# Patient Record
Sex: Male | Born: 1955 | Race: Black or African American | Hispanic: No | Marital: Married
Health system: Midwestern US, Community
[De-identification: ages and names within clinical notes are randomized; demographics above are authoritative.]

## PROBLEM LIST (undated history)

## (undated) DIAGNOSIS — M25562 Pain in left knee: Secondary | ICD-10-CM

## (undated) DIAGNOSIS — G8929 Other chronic pain: Secondary | ICD-10-CM

## (undated) DIAGNOSIS — M25561 Pain in right knee: Secondary | ICD-10-CM

## (undated) DIAGNOSIS — I10 Essential (primary) hypertension: Secondary | ICD-10-CM

## (undated) DIAGNOSIS — R972 Elevated prostate specific antigen [PSA]: Secondary | ICD-10-CM

## (undated) DIAGNOSIS — K852 Alcohol induced acute pancreatitis without necrosis or infection: Secondary | ICD-10-CM

## (undated) DIAGNOSIS — F17209 Nicotine dependence, unspecified, with unspecified nicotine-induced disorders: Secondary | ICD-10-CM

## (undated) DIAGNOSIS — C4491 Basal cell carcinoma of skin, unspecified: Secondary | ICD-10-CM

## (undated) DIAGNOSIS — M1990 Unspecified osteoarthritis, unspecified site: Secondary | ICD-10-CM

## (undated) DIAGNOSIS — H269 Unspecified cataract: Secondary | ICD-10-CM

## (undated) DIAGNOSIS — T7840XA Allergy, unspecified, initial encounter: Secondary | ICD-10-CM

## (undated) HISTORY — DX: Unspecified cataract: H26.9

## (undated) HISTORY — PX: KNEE ARTHROSCOPY: SUR90

## (undated) HISTORY — DX: Unspecified osteoarthritis, unspecified site: M19.90

## (undated) HISTORY — DX: Allergy, unspecified, initial encounter: T78.40XA

## (undated) HISTORY — PX: WISDOM TOOTH EXTRACTION: SHX21

---
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
## 1973-09-13 HISTORY — PX: CYSTECTOMY: SUR359

## 1997-09-13 HISTORY — PX: DENTAL SURGERY: SHX609

## 1998-10-28 DIAGNOSIS — C4491 Basal cell carcinoma of skin, unspecified: Secondary | ICD-10-CM

## 1998-10-28 HISTORY — DX: Basal cell carcinoma of skin, unspecified: C44.91

## 2008-05-28 LAB — HEPATIC FUNCTION PANEL
A-G Ratio: 0.8 — ABNORMAL LOW (ref 1.1–2.2)
ALT (SGPT): 266 U/L — ABNORMAL HIGH (ref 30–65)
AST (SGOT): 545 U/L — ABNORMAL HIGH (ref 15–37)
Albumin: 3.2 g/dL — ABNORMAL LOW (ref 3.5–5.0)
Alk. phosphatase: 138 U/L — ABNORMAL HIGH (ref 50–136)
Bilirubin, direct: 0.2 MG/DL (ref 0.0–0.3)
Bilirubin, total: 1 MG/DL — ABNORMAL HIGH (ref <1.0)
Globulin: 4 g/dL (ref 2.0–4.0)
Protein, total: 7.2 g/dL (ref 6.4–8.2)

## 2008-05-28 LAB — METABOLIC PANEL, BASIC
Anion gap: 18 mmol/L — ABNORMAL HIGH (ref 5–15)
BUN/Creatinine ratio: 10 — ABNORMAL LOW (ref 12–20)
BUN: 10 MG/DL (ref 6–20)
CO2: 17 MMOL/L — ABNORMAL LOW (ref 21–32)
Calcium: 7.7 MG/DL — ABNORMAL LOW (ref 8.5–10.1)
Chloride: 100 MMOL/L (ref 97–108)
Creatinine: 1 MG/DL (ref 0.6–1.3)
GFR est AA: >60 mL/min/{1.73_m2} (ref >60)
GFR est non-AA: >60 mL/min/{1.73_m2} (ref >60)
Glucose: 139 MG/DL — ABNORMAL HIGH (ref 75–110)
Potassium: 4.1 MMOL/L (ref 3.5–5.1)
Sodium: 135 MMOL/L — ABNORMAL LOW (ref 136–145)

## 2008-05-28 LAB — LIPASE: Lipase: 660 U/L — ABNORMAL HIGH (ref 114–286)

## 2008-05-28 LAB — ETHYL ALCOHOL: ALCOHOL(ETHYL),SERUM: 72 MG/DL — AB

## 2008-05-28 LAB — AMYLASE: Amylase: 85 U/L (ref 25–115)

## 2008-08-02 LAB — CBC WITH AUTOMATED DIFF
ABS. BASOPHILS: 0 10*3/uL (ref 0.0–0.1)
ABS. EOSINOPHILS: 0.1 10*3/uL (ref 0.0–0.4)
ABS. LYMPHOCYTES: 3.1 10*3/uL (ref 0.8–3.5)
ABS. MONOCYTES: 0.5 10*3/uL (ref 0–1.0)
ABS. NEUTROPHILS: 4 10*3/uL (ref 1.8–8.0)
BASOPHILS: 1 % (ref 0–1)
EOSINOPHILS: 1 % (ref 0–7)
HCT: 38.5 % (ref 36.6–50.3)
HGB: 13.8 g/dL (ref 12.1–17.0)
LYMPHOCYTES: 40 % (ref 12–49)
MCH: 30.5 PG (ref 26.0–34.0)
MCHC: 35.8 g/dL — ABNORMAL HIGH (ref 30.0–35.0)
MCV: 85 FL (ref 80.0–99.0)
MONOCYTES: 7 % (ref 5–13)
NEUTROPHILS: 51 % (ref 32–75)
PLATELET: 329 10*3/uL (ref 150–400)
RBC: 4.53 M/uL (ref 4.10–5.70)
RDW: 14.9 % — ABNORMAL HIGH (ref 11.5–14.5)
WBC: 7.8 10*3/uL (ref 4.1–11.1)

## 2008-08-02 LAB — METABOLIC PANEL, COMPREHENSIVE
A-G Ratio: 0.9 — ABNORMAL LOW (ref 1.1–2.2)
ALT (SGPT): 35 U/L (ref 30–65)
AST (SGOT): 18 U/L (ref 15–37)
Albumin: 3.7 g/dL (ref 3.5–5.0)
Alk. phosphatase: 73 U/L (ref 50–136)
Anion gap: 12 mmol/L (ref 5–15)
BUN/Creatinine ratio: 12 (ref 12–20)
BUN: 12 MG/DL (ref 6–20)
Bilirubin, total: 0.2 MG/DL (ref <1.0)
CO2: 24 MMOL/L (ref 21–32)
Calcium: 8.6 MG/DL (ref 8.5–10.1)
Chloride: 107 MMOL/L (ref 97–108)
Creatinine: 1 MG/DL (ref 0.6–1.3)
GFR est AA: >60 mL/min/{1.73_m2} (ref >60)
GFR est non-AA: >60 mL/min/{1.73_m2} (ref >60)
Globulin: 4 g/dL (ref 2.0–4.0)
Glucose: 85 MG/DL (ref 50–100)
Potassium: 2.8 MMOL/L — ABNORMAL LOW (ref 3.5–5.1)
Protein, total: 7.7 g/dL (ref 6.4–8.2)
Sodium: 143 MMOL/L (ref 136–145)

## 2008-08-02 LAB — TROPONIN I
Troponin-I, Qt.: <0.04 ng/mL (ref 0.0–0.05)
Troponin-I, Qt.: <0.04 ng/mL (ref 0.0–0.05)

## 2008-08-02 LAB — CK W/ REFLX CKMB
CK: 136 U/L (ref 35–232)
CK: 109 U/L (ref 35–232)

## 2008-08-02 LAB — ETHYL ALCOHOL: ALCOHOL(ETHYL),SERUM: 150 MG/DL — AB

## 2009-01-23 MED ORDER — HYDROCODONE-ACETAMINOPHEN 5 MG-500 MG TAB
5-500 mg | ORAL_TABLET | Freq: Four times a day (QID) | ORAL | Status: DC | PRN
Start: 2009-01-23 — End: 2009-12-14

## 2009-01-23 MED ORDER — IBUPROFEN 600 MG TAB
600 mg | ORAL_TABLET | Freq: Four times a day (QID) | ORAL | Status: DC | PRN
Start: 2009-01-23 — End: 2009-12-14

## 2009-01-23 NOTE — ED Notes (Shared)
.  kmkdc pt. is stable at discharge, pt. has no complaints of pain, pt. vital signs are stable or within defined limits set by MD, pt. expresses understanding of discharge instructions and has, if applicable, ordered prescriptions. Pt. expresses positive results of discharge. Pt. informed to follow up with primary care within defined limits set by MD or return to ER if symptoms persist.

## 2009-01-23 NOTE — ED Provider Notes (Cosign Needed)
Patient is a 53 y.o. male presenting with shoulder injury. The history is provided by the patient.   Shoulder Injury   The incident occurred 3 to 5 hours ago. The incident occurred at home. The injury mechanism was a fall. The left shoulder is affected. The pain is at a severity of 8/10. The pain is moderate. The pain has been constant since onset. The pain does not radiate. There is no history of shoulder injury. He has no other injuries. There is no history of shoulder surgery. Pertinent negatives include no numbness and no tingling. He reports no foreign bodies present.        No past medical history on file.     No past surgical history on file.      No family history on file.     History   Social History   ??? Marital Status: Married     Spouse Name: N/A     Number of Children: N/A   ??? Years of Education: N/A   Occupational History   ??? Not on file.   Social History Main Topics   ??? Tobacco Use: Not on file   ??? Alcohol Use: Not on file   ??? Drug Use: Not on file   ??? Sexually Active: Not on file   Other Topics Concern   ??? Not on file   Social History Narrative   ??? No narrative on file           ALLERGIES: Review of patient's allergies indicates no known allergies.      Review of Systems   Constitutional: Negative.    HENT: Negative.    Eyes: Negative.    Respiratory: Negative.    Cardiovascular: Negative.    Gastrointestinal: Negative.    Genitourinary: Negative.    Musculoskeletal: Positive for arthralgias.   Neurological: Negative for tingling and numbness.       Filed Vitals:    01/23/2009 11:51 AM   BP: 140/90   Pulse: 84   Temp: 98.6 ??F (37 ??C)   Resp: 16   Height: 5\' 9"  (1.753 m)   Weight: 210 lb (95.255 kg)   SpO2: 99%              Physical Exam   Constitutional: He is oriented. He appears well-developed and well-nourished. No distress.   HENT:   Head: Normocephalic and atraumatic.   Eyes: Conjunctivae and extraocular motions are normal. Pupils are equal, round, and reactive to light.    Cardiovascular: Normal rate, regular rhythm and normal heart sounds.  Exam reveals no gallop and no friction rub.    No murmur heard.  Pulmonary/Chest: Effort normal and breath sounds normal. No respiratory distress. He has no wheezes. He has no rales.   Musculoskeletal:        Left shoulder: He exhibits decreased range of motion, tenderness, bony tenderness and pain.        Slight depression of shoulder noted, TTP along distal clavicle.     Neurological: He is alert and oriented.   Skin: Skin is warm and dry.   Psychiatric: He has a normal mood and affect. His behavior is normal. Judgment and thought content normal.            Coding      Procedures

## 2009-01-24 NOTE — ED Notes (Signed)
I have personally seen and evaluated patient. I find the patient's history and physical exam are consistent with the PA's NP documentation. I agree with the care provided, treatments rendered, disposition and follow up plan.

## 2009-12-14 ENCOUNTER — Inpatient Hospital Stay
Admit: 2009-12-14 | Discharge: 2009-12-16 | Disposition: A | Discharge status: Home / Self Care | Payer: Charity | Attending: Internal Medicine | Admitting: Internal Medicine

## 2009-12-14 DIAGNOSIS — K859 Acute pancreatitis without necrosis or infection, unspecified: Secondary | ICD-10-CM

## 2009-12-14 LAB — CBC WITH AUTOMATED DIFF
ABS. BASOPHILS: 0 10*3/uL (ref 0.0–0.1)
ABS. EOSINOPHILS: 0 10*3/uL (ref 0.0–0.4)
ABS. LYMPHOCYTES: 1.9 10*3/uL (ref 0.8–3.5)
ABS. MONOCYTES: 0.6 10*3/uL (ref 0.0–1.0)
ABS. NEUTROPHILS: 7.1 10*3/uL (ref 1.8–8.0)
BASOPHILS: 0 % (ref 0–1)
EOSINOPHILS: 0 % (ref 0–7)
HCT: 40.2 % (ref 36.6–50.3)
HGB: 14.6 g/dL (ref 12.1–17.0)
LYMPHOCYTES: 19 % (ref 12–49)
MCH: 31.7 PG (ref 26.0–34.0)
MCHC: 36.3 g/dL (ref 30.0–36.5)
MCV: 87.2 FL (ref 80.0–99.0)
MONOCYTES: 7 % (ref 5–13)
NEUTROPHILS: 74 % (ref 32–75)
PLATELET: 192 10*3/uL (ref 150–400)
RBC: 4.61 M/uL (ref 4.10–5.70)
RDW: 14.1 % (ref 11.5–14.5)
WBC: 9.3 10*3/uL (ref 4.1–11.1)

## 2009-12-14 LAB — METABOLIC PANEL, COMPREHENSIVE
A-G Ratio: 0.7 — ABNORMAL LOW (ref 1.1–2.2)
ALT (SGPT): 94 U/L — ABNORMAL HIGH (ref 12–78)
AST (SGOT): 108 U/L — ABNORMAL HIGH (ref 15–37)
Albumin: 2.9 g/dL — ABNORMAL LOW (ref 3.5–5.0)
Alk. phosphatase: 126 U/L (ref 50–136)
Anion gap: 9 mmol/L (ref 5–15)
BUN/Creatinine ratio: 17 (ref 12–20)
BUN: 15 MG/DL (ref 6–20)
Bilirubin, total: 0.5 MG/DL (ref 0.2–1.0)
CO2: 23 MMOL/L (ref 21–32)
Calcium: 8.2 MG/DL — ABNORMAL LOW (ref 8.5–10.1)
Chloride: 101 MMOL/L (ref 97–108)
Creatinine: 0.9 MG/DL (ref 0.6–1.3)
GFR est AA: >60 mL/min/{1.73_m2} (ref >60)
GFR est non-AA: >60 mL/min/{1.73_m2} (ref >60)
Globulin: 4.3 g/dL — ABNORMAL HIGH (ref 2.0–4.0)
Glucose: 143 MG/DL — ABNORMAL HIGH (ref 65–100)
Potassium: 3.4 MMOL/L — ABNORMAL LOW (ref 3.5–5.1)
Protein, total: 7.2 g/dL (ref 6.4–8.2)
Sodium: 133 MMOL/L — ABNORMAL LOW (ref 136–145)

## 2009-12-14 LAB — ETHYL ALCOHOL: ALCOHOL(ETHYL),SERUM: 165 MG/DL — ABNORMAL HIGH (ref <10)

## 2009-12-14 LAB — LIPASE: Lipase: 1109 U/L — ABNORMAL HIGH (ref 73–393)

## 2009-12-14 LAB — AMYLASE: Amylase: 179 U/L — ABNORMAL HIGH (ref 25–115)

## 2009-12-14 MED ORDER — LIDOCAINE 2 % MUCOSAL SOLN
2 % | Freq: Once | Status: AC
Start: 2009-12-14 — End: 2009-12-14
  Administered 2009-12-14: 07:00:00 via ORAL

## 2009-12-14 MED ORDER — ACETAMINOPHEN 325 MG TABLET
325 mg | ORAL | Status: DC | PRN
Start: 2009-12-14 — End: 2009-12-16

## 2009-12-14 MED ORDER — ONDANSETRON (PF) 4 MG/2 ML INJECTION
4 mg/2 mL | INTRAMUSCULAR | Status: DC | PRN
Start: 2009-12-14 — End: 2009-12-16
  Administered 2009-12-14 – 2009-12-15 (×3): via INTRAVENOUS

## 2009-12-14 MED ORDER — HYDROMORPHONE (PF) 1 MG/ML IJ SOLN
1 mg/mL | INTRAMUSCULAR | Status: DC | PRN
Start: 2009-12-14 — End: 2009-12-16
  Administered 2009-12-14 – 2009-12-15 (×5): via INTRAVENOUS

## 2009-12-14 MED ORDER — ENOXAPARIN 40 MG/0.4 ML SUB-Q SYRINGE
40 mg/0.4 mL | Freq: Every day | SUBCUTANEOUS | Status: DC
Start: 2009-12-14 — End: 2009-12-16
  Administered 2009-12-14 – 2009-12-16 (×3): via SUBCUTANEOUS

## 2009-12-14 MED ORDER — LORAZEPAM 2 MG/ML IJ SOLN
2 mg/mL | Freq: Four times a day (QID) | INTRAMUSCULAR | Status: DC | PRN
Start: 2009-12-14 — End: 2009-12-16

## 2009-12-14 MED ORDER — NALOXONE 1 MG/ML IJ SYRG(AKA NARCAN)
1 mg/mL | INTRAMUSCULAR | Status: DC | PRN
Start: 2009-12-14 — End: 2009-12-16

## 2009-12-14 MED ORDER — NICOTINE 7 MG/24 HR DAILY PATCH
7 mg/24 hr | TRANSDERMAL | Status: DC
Start: 2009-12-14 — End: 2009-12-16
  Administered 2009-12-14 – 2009-12-16 (×3): via TRANSDERMAL

## 2009-12-14 MED ORDER — SODIUM CHLORIDE 0.9% BOLUS IV
0.9 % | Freq: Once | INTRAVENOUS | Status: AC
Start: 2009-12-14 — End: 2009-12-14
  Administered 2009-12-14: 08:00:00 via INTRAVENOUS

## 2009-12-14 MED ORDER — PANTOPRAZOLE 40 MG IV SOLR
40 mg | Freq: Every day | INTRAVENOUS | Status: DC
Start: 2009-12-14 — End: 2009-12-16
  Administered 2009-12-14 – 2009-12-16 (×3): via INTRAVENOUS

## 2009-12-14 MED ORDER — HYDROMORPHONE (PF) 1 MG/ML IJ SOLN
1 mg/mL | Freq: Once | INTRAMUSCULAR | Status: AC
Start: 2009-12-14 — End: 2009-12-14
  Administered 2009-12-14: 08:00:00 via INTRAVENOUS

## 2009-12-14 MED ORDER — DEXTROSE 5% IN NORMAL SALINE IV
INTRAVENOUS | Status: AC
Start: 2009-12-14 — End: 2009-12-16
  Administered 2009-12-14 – 2009-12-16 (×5): via INTRAVENOUS

## 2009-12-14 MED ORDER — ONDANSETRON 4 MG TAB, RAPID DISSOLVE
4 mg | ORAL | Status: AC
Start: 2009-12-14 — End: 2009-12-14
  Administered 2009-12-14: 07:00:00 via ORAL

## 2009-12-14 MED ORDER — SODIUM CHLORIDE 0.9 % IV
Freq: Once | INTRAVENOUS | Status: AC
Start: 2009-12-14 — End: 2009-12-14
  Administered 2009-12-14: 10:00:00 via INTRAVENOUS

## 2009-12-14 MED FILL — PHENOBARB-BELLADONNA ALKALOIDS 16.2 MG/5 ML ELIXIR: ORAL | Qty: 10

## 2009-12-14 MED FILL — HYDROMORPHONE (PF) 1 MG/ML IJ SOLN: 1 mg/mL | INTRAMUSCULAR | Qty: 1

## 2009-12-14 MED FILL — SODIUM CHLORIDE 0.9 % INJECTION: INTRAMUSCULAR | Qty: 10

## 2009-12-14 MED FILL — DEXTROSE 5% IN NORMAL SALINE IV: INTRAVENOUS | Qty: 1000

## 2009-12-14 MED FILL — ONDANSETRON (PF) 4 MG/2 ML INJECTION: 4 mg/2 mL | INTRAMUSCULAR | Qty: 2

## 2009-12-14 MED FILL — SODIUM CHLORIDE 0.9 % IV: INTRAVENOUS | Qty: 1000

## 2009-12-14 MED FILL — LOVENOX 40 MG/0.4 ML SUBCUTANEOUS SYRINGE: 40 mg/0.4 mL | SUBCUTANEOUS | Qty: 0.4

## 2009-12-14 MED FILL — HYDROMORPHONE (PF) 1 MG/ML IJ SOLN: 1 mg/mL | INTRAMUSCULAR | Qty: 2

## 2009-12-14 MED FILL — ONDANSETRON 4 MG TAB, RAPID DISSOLVE: 4 mg | ORAL | Qty: 1

## 2009-12-14 MED FILL — NICOTINE 7 MG/24 HR DAILY PATCH: 7 mg/24 hr | TRANSDERMAL | Qty: 1

## 2009-12-14 NOTE — H&P (Signed)
Name:       Kevin Davenport, Kevin Davenport                   Admitted:    12/14/2009  MR #:       [id number]                        DOB:         1956/05/30  Account #:  [id number]                     Age:         54  Physician:  Gabriel Carina, M.D.             Location:    220 01                                HISTORY   PHYSICAL      HISTORY OF PRESENT ILLNESS:  This patient was admitted with a diagnosis of  pancreatitis. I saw the patient around 8:30 a.m. in room 220, along with  Dawn, Charity fundraiser.    This patient is alert and oriented and is able to give clear history. He  states that he started having pain in his periumbilical area since  yesterday, and the pain was excruciating and came up to 10/10. He came to  the emergency room where he was noted to have acute pancreatitis;  therefore, he was recommended to be hospitalized. I gave oral online orders  and saw the patient this morning.    The patient states that he has had pancreatitis last year and was  hospitalized [facility] as well.    PAST MEDICAL HISTORY:  The patient's past medical history is significant  for, but probably not limited to the following:  1. Ongoing alcohol abuse. Currently, the patient drinks _12 cans of  beer per day. He denies any hard liquor use.  2. Two packs of cigarette smoking per day.  3. The patient denies any illicit drug abuse.  4. History of pancreatitis in the past.    SOCIAL HISTORY:  The patient is married and lives with his wife in the  [place] area. He is an Journalist, newspaper, currently laid off.    FAMILY HISTORY:  Patient's mother is 98, suffers from diabetes mellitus.  Patient's father is 24 and apparently healthy.    REVIEW OF SYSTEMS:  The patient denied any syncope or presyncopal episodes,  blurred vision, double vision, epistaxis, hemoptysis, hematemesis, or  melena. He was nauseous and had been throwing up earlier. The patient also  complains of GERD symptoms. Denies any chest pain, palpitations, shortness   of breath, wheezing, cough, or production of sputum. The patient denies any  diarrhea, constipation, leg pain, leg cramps, or swelling. The patient  denies any numbness, tingling sensation, weakness in one part of the body  or the other.    PHYSICAL EXAMINATION  GENERAL:  The patient is a pleasant, alert, oriented, and somewhat tired  appearing gentleman.  VITAL SIGNS:  Noted as documented in the chart.  HEENT:  Head is atraumatic and normocephalic. Extraocular movements are  intact. Oral mucosa moist and pink. No JVD or thyromegaly noted.  LUNGS:  Clear to auscultation. No wheeze or crepitation. No sign of  consolidation or effusion appreciated.  HEART:  Normal S1, S2. No S3 or S4 appreciated.  ABDOMEN:  Soft. Tenderness noted in the epigastric and umbilical area. No  radiation and no rebound tenderness appreciated. Bowel sounds positive, but  sluggish. No flank tenderness noted. No Faythe Casa or Cullen signs noted.  RECTAL AND GENITOURINARY:  Examination not done.  MUSCULOSKELETAL:  Unremarkable for any signs of DVT, cyanosis, or jaundice.  The patient has several tattoos on his upper extremities.  NEUROLOGICAL:  Grossly unremarkable for any cranial or sensorimotor or  focal or lateralizing neurological deficits.    LABORATORY DATA:  Reviewed.    ASSESSMENT  1. Acute pancreatitis.  2. Ongoing alcohol abuse.  3. History of tobacco abuse and ongoing tobacco addiction.  4. History of previous pancreatitis.    PLAN  1. Given the clinical scenario, the patient is admitted to a telemetry bed  with the diagnosis of acute pancreatitis. Full admission orders are  available in the chart. In summary, we will put the patient n.p.o. status,  give him IV fluids and IV pain medications. We will also give him proton  pump inhibitor.  2. We will give him deep venous thrombosis prophylaxis.  3. Gastroenterology consultation has been requested.  4. We will have to watch him for delirium tremens.   5. Nicotine patch will be ordered for his nicotine addiction.    Overall, the patient's condition is reasonably stable. The patient's care  will be transferred to Dr. Oneida Arenas, who is taking over on the hospitalist  service.                  Gabriel Carina, M.D.    cc:                       Gabriel Carina, M.D.        HMY/wmx; D: 12/14/2009  8:52 A; T: 12/14/2009  6:28 A; DOC# [id number]; Job#  [id number]

## 2009-12-14 NOTE — Progress Notes (Signed)
Bedside and Verbal shift change report given to Antoinette, RN (oncoming nurse) by Dawn, LPN (offgoing nurse).  Report given with SBAR.

## 2009-12-14 NOTE — ED Notes (Signed)
Discussed with Dr. Don Broach and agree to admit him,  Pt notified and reviewed his labs and Ct results agree to be admitted

## 2009-12-14 NOTE — ED Notes (Signed)
Pt reports vomiting "part of the day." Denies diarrhea.

## 2009-12-14 NOTE — ED Notes (Signed)
Dr. Normajean Glasgow has ordered patient to be NPO. He did agree to allow the patient to have a few ice chips. Given.

## 2009-12-14 NOTE — Progress Notes (Signed)
TRANSFER - OUT REPORT:    Verbal report given to Dawn, LPN (name) on Rino Hosea  being transferred to Telemetry (unit) for routine progression of care       Report consisted of patient???s Situation, Background, Assessment and   Recommendations(SBAR).     Information from the following report(s) SBAR was reviewed with the receiving nurse.    Opportunity for questions and clarification was provided.

## 2009-12-14 NOTE — ED Notes (Signed)
Pt presents to ED complaining of "my stomach all the way around." Patient describes the pain as cramping for past 2 days. Patient states he feels bloated and that his stomach will "go down but then it comes back up". Pt has been eating but emesis is only clear with no food. Vomiting started this AM.

## 2009-12-14 NOTE — Consults (Signed)
Name:        Kevin Davenport, Kevin Davenport                   Admitted:  12/14/2009  MR #:        [id number]                        DOB:       1956-04-07  Account #:   [id number]                     Age:       54  Consultant:  Dequavion Follette J. Emilee Hero, MD              Location:  220 01                                CONSULTATION REPORT    DATE OF CONSULTATION:          12/14/2009      HISTORY OF PRESENT ILLNESS:  This is a 54 year old, African American male  who was admitted to the hospital yesterday because of an onset of severe  mid abdominal pain and some nausea and vomiting. The patient was  subsequently found to have what was believed to be an acute episode of  acute pancreatitis related to ETOH abuse. The patient admits to a history  of having had pancreatitis last year and was admitted at [facility]. The patient has continued to drink alcohol in the form of beer,  he says at least a case every 2 days. He denies any other alcohol intake.  With episode of nausea and vomiting yesterday he did not see any blood  associated with the vomiting. Because of the severity of his pain and the  fact that he does have a diagnosis of acute pancreatitis he was admitted to  the hospital for further management of his problem.    PAST MEDICAL HISTORY:  As mentioned previously he has had a bout of  pancreatitis last year for which he required admission. He also admits to  ETOH abuse in the form of a case of beer every 2 days. He smokes 2 packs of  cigarettes a day, but does not use any illicit drugs.    ALLERGIES:  HE HAS NO ALLERGIES TO ANY SPECIFIC DRUGS OR MEDICATIONS.    SOCIAL HISTORY:  The patient is married and lives with his wife. He is  currently unemployed.    FAMILY HISTORY:  Essentially unremarkable relative to this present  admission.    REVIEW OF SYSTEMS:  Positive for his nausea as well as his relative severe  epigastric pain, which is improved since his admission. He has no other   symptom complaints related to any other system.    PHYSICAL EXAMINATION  GENERAL:  The patient is alert and oriented and states that his abdominal  pain is less than when he was admitted yesterday.  VITAL SIGNS:  Show temperature 97.8, pulse 82 and regular, respirations 20  and regular. Blood pressure is 147/101, O2 saturation is 98% on room air.  HEENT:  Unremarkable except for missing teeth.  NECK:  The neck is supple without thyromegaly or masses. No jugular venous  distention is noted.  LUNGS:  Clear to auscultation.  HEART:  Shows a regular rhythm with no significant murmurs or gallops  noted.  ABDOMEN:  Soft,  flat. Bowel sounds are present. There is significant mid  epigastric tenderness without rebound. No organomegaly or masses are  appreciated.  RECTAL:  Deferred.  SKIN:  Warm and dry.  LYMPHATICS:  Unremarkable.  NEUROLOGICAL:  Intact without focal deficits or pathological reflexes.    LABORATORY DATA:  CBC shows a WBC of 9.3, hemoglobin 14.6, hematocrit 40.2,  with a no left shift noted. CMP is remarkable only for sodium slightly  decreased at 133 and potassium slightly decreased at 3.4. Glucose is  somewhat elevated at 143. Calcium is a little low at 8.2. ALT is 94, AST is  108, which is elevated. Albumin is 2.9. Globulin slightly elevated at 4.3.  All other values are normal related to his recent CMP. Serum amylase is  179, and serum lipase is markedly elevated at 1109. Alcohol level was 165  mg/dL, which is elevated.    IMPRESSIONS  1. Acute pancreatitis secondary to ETOH abuse. Recent CT scan shows some  inflammation around the head of the pancreas and body. Otherwise the CT  scan was unremarkable.  2. Alcohol abuse.  3. Nicotine abuse.  4. Fatty liver secondary to his alcohol abuse.    PLAN  1. Will continue present IV hydration.  2. Continue the patient on n.p.o. status.  3. Will follow amylase and lipase enzymes on a daily basis and note  response.   4. Pain medication as needed for his abdominal pain related to the  pancreatitis.  5. Be aware of the possibility of ETOH withdrawal.  6. Will maintain the patient on a proton pump inhibitor q.24 hours  intravenously at this time.            Cristle Jared J. Emilee Hero, MD    cc:   Isobel Eisenhuth J. Emilee Hero, MD        WJL/wmx; D: 12/14/2009  5:56 P; T: 12/14/2009  3:27 P; DOC# [id number]; Job#  [id number]

## 2009-12-14 NOTE — ED Notes (Signed)
Pt in CT

## 2009-12-14 NOTE — ED Notes (Signed)
Assumed care of patient, received report from K. Bergdoll, RN.

## 2009-12-14 NOTE — ED Provider Notes (Signed)
Patient is a 54 y.o. male presenting with abdominal pain. The history is provided by the patient. No language interpreter was used.   Abdominal Pain   This is a new problem. The current episode started 2 days ago. The problem occurs constantly. The problem has not changed since onset. The pain is associated with vomiting. The pain is located in the epigastric region. The pain is at a severity of 7/10. Associated symptoms include nausea and vomiting. Pertinent negatives include no fever, no diarrhea, no constipation, no dysuria, no frequency, no hematuria, no headaches, no arthralgias, no myalgias, no trauma, no chest pain and no back pain. The pain is worsened by drinking alcohol (He stated that he drink alcohol today). His past medical history does not include gallstones, GERD, ulcerative colitis, irritable bowel syndrome, cancer, pancreatitis or small bowel obstruction.        No past medical history on file.     No past surgical history on file.      No family history on file.     History   Social History   ??? Marital Status: Married     Spouse Name: N/A     Number of Children: N/A   ??? Years of Education: N/A   Occupational History   ??? Not on file.   Social History Main Topics   ??? Smoking status: Current Everyday Smoker   ??? Smokeless tobacco: Not on file   ??? Alcohol Use: Yes   ??? Drug Use: No   ??? Sexually Active:    Other Topics Concern   ??? Not on file   Social History Narrative   ??? No narrative on file           ALLERGIES: Review of patient's allergies indicates no known allergies.      Review of Systems   Constitutional: Negative for fever.   Cardiovascular: Negative for chest pain.   Gastrointestinal: Positive for nausea, vomiting and abdominal pain. Negative for diarrhea and constipation.   Genitourinary: Negative for dysuria, frequency and hematuria.   Musculoskeletal: Negative for myalgias, back pain and arthralgias.   Neurological: Negative for headaches.   All other systems reviewed and are negative.         Filed Vitals:    12/14/09 0016 12/14/09 0500   BP: 122/82 130/85   Pulse: 96 92   Temp: 98.3 ??F (36.8 ??C)    Resp: 22 20   Height: 5\' 9"  (1.753 m)    Weight: 210 lb (95.255 kg)    SpO2: 100% 96%              Physical Exam   Nursing note and vitals reviewed.  Constitutional: He is oriented to person, place, and time. He appears well-developed and well-nourished. No distress.   HENT:   Head: Normocephalic and atraumatic.   Right Ear: External ear normal.   Left Ear: External ear normal.   Mouth/Throat: Oropharynx is clear and moist. No oropharyngeal exudate.   Eyes: Conjunctivae and extraocular motions are normal. Pupils are equal, round, and reactive to light. Right eye exhibits no discharge. Left eye exhibits no discharge. No scleral icterus.   Neck: Normal range of motion. Neck supple. No JVD present. No tracheal deviation present. No thyromegaly present.   Cardiovascular: Normal rate, regular rhythm, normal heart sounds and intact distal pulses.    No murmur heard.  Pulmonary/Chest: Effort normal and breath sounds normal. No stridor. No respiratory distress. He has no wheezes. He has no rales.  Abdominal: Soft. Bowel sounds are normal. He exhibits no distension and no mass. Tenderness is present. He has no rebound and no guarding.   Musculoskeletal: Normal range of motion. He exhibits no edema and no tenderness.   Lymphadenopathy:     He has no cervical adenopathy.   Neurological: He is alert and oriented to person, place, and time. No cranial nerve deficit. Coordination normal.   Skin: Skin is warm. No rash noted. He is not diaphoretic. No erythema. No pallor.   Psychiatric: He has a normal mood and affect. His behavior is normal. Judgment and thought content normal.        MDM    Procedures

## 2009-12-14 NOTE — ED Notes (Signed)
The abdominal pain is getting better, No vomiting or fever. V/S stable

## 2009-12-14 NOTE — Progress Notes (Signed)
Problem: Alcohol Withdrawal  Goal: *STG: Participates in treatment plan  Outcome: Progressing Towards Goal  The care plan was reviewed with patient.  Care plan options were discussed and questions answered.  The patient understand instructions and will follow up as directed.

## 2009-12-14 NOTE — Progress Notes (Signed)
TRANSFER - IN REPORT:    Verbal report received from Diana, RN(name) on Nazir Hacker  being received from Emergency Room(unit) for routine progression of care      Report consisted of patient???s Situation, Background, Assessment and   Recommendations(SBAR).     Information from the following report(s) SBAR was reviewed with the receiving nurse.    Opportunity for questions and clarification was provided.      Assessment completed upon patient???s arrival to unit and care assumed.     Came in with complaints of abdomen hurting past 2 days like bloated, go down and bloat back up. Eating but throwing up with clear emesis. No diarrhea, was NPO downstairs, 0630 stated abdominal pain better, afebrile, pt smells of ETOH, ETOH on admission to ER was 165. Lipase 1109, amylase 179, bili 0.5, AST 108, globulin 4.3, glucose 143  No past medical history. Smoker,   20gauge in RAC,   Given in ER  4mg  zofran  GI cocktail  2Liters of NS 1/2 done now still running @210 /hr on dial a flow  1mg  dilauded  CT abdomen pelvis without contrast  Showed fatty infiltration of liver, inflammatioin adjacent to pancreatic head and mildly adjacent to body of pancreas,,   ACUTe pancreastitis  Hepatic steatosis  Alert and oriented  Lung clear  Last VS 136/88,92,18,99% on room air  EKG NSR

## 2009-12-15 LAB — METABOLIC PANEL, BASIC
Anion gap: 8 mmol/L (ref 5–15)
BUN/Creatinine ratio: 7 — ABNORMAL LOW (ref 12–20)
BUN: 7 MG/DL (ref 6–20)
CO2: 25 MMOL/L (ref 21–32)
Calcium: 8.2 MG/DL — ABNORMAL LOW (ref 8.5–10.1)
Chloride: 101 MMOL/L (ref 97–108)
Creatinine: 1 MG/DL (ref 0.6–1.3)
GFR est AA: >60 mL/min/{1.73_m2} (ref >60)
GFR est non-AA: >60 mL/min/{1.73_m2} (ref >60)
Glucose: 116 MG/DL — ABNORMAL HIGH (ref 65–100)
Potassium: 3.4 MMOL/L — ABNORMAL LOW (ref 3.5–5.1)
Sodium: 134 MMOL/L — ABNORMAL LOW (ref 136–145)

## 2009-12-15 LAB — CBC WITH AUTOMATED DIFF
ABS. BASOPHILS: 0 10*3/uL (ref 0.0–0.1)
ABS. EOSINOPHILS: 0 10*3/uL (ref 0.0–0.4)
ABS. LYMPHOCYTES: 1.3 10*3/uL (ref 0.8–3.5)
ABS. MONOCYTES: 0.5 10*3/uL (ref 0.0–1.0)
ABS. NEUTROPHILS: 6.9 10*3/uL (ref 1.8–8.0)
BASOPHILS: 0 % (ref 0–1)
EOSINOPHILS: 0 % (ref 0–7)
HCT: 37 % (ref 36.6–50.3)
HGB: 13.1 g/dL (ref 12.1–17.0)
LYMPHOCYTES: 15 % (ref 12–49)
MCH: 31.6 PG (ref 26.0–34.0)
MCHC: 35.4 g/dL (ref 30.0–36.5)
MCV: 89.2 FL (ref 80.0–99.0)
MONOCYTES: 6 % (ref 5–13)
NEUTROPHILS: 79 % — ABNORMAL HIGH (ref 32–75)
PLATELET: 163 10*3/uL (ref 150–400)
RBC: 4.15 M/uL (ref 4.10–5.70)
RDW: 14.4 % (ref 11.5–14.5)
WBC: 8.7 10*3/uL (ref 4.1–11.1)

## 2009-12-15 LAB — DRUG SCREEN, URINE
AMPHETAMINES: NEGATIVE
BARBITURATES: NEGATIVE
BENZODIAZEPINES: NEGATIVE
COCAINE: NEGATIVE
ECSTASY, MDMA: NEGATIVE
METHADONE: NEGATIVE
OPIATES: POSITIVE — AB
PCP(PHENCYCLIDINE): NEGATIVE
THC (TH-CANNABINOL): NEGATIVE

## 2009-12-15 LAB — AMYLASE: Amylase: 82 U/L (ref 25–115)

## 2009-12-15 LAB — LIPID PANEL
CHOL/HDL Ratio: 15.5 — ABNORMAL HIGH (ref 0–5.0)
Cholesterol, total: 356 MG/DL — ABNORMAL HIGH (ref <200)
HDL Cholesterol: 23 MG/DL
LDL, calculated: ELEVATED MG/DL (ref 0–100)
Triglyceride: 1939 MG/DL — ABNORMAL HIGH (ref <150)

## 2009-12-15 LAB — LIPASE
Lipase: 927 U/L — ABNORMAL HIGH (ref 73–393)
Lipase: 345 U/L (ref 73–393)

## 2009-12-15 LAB — EKG, 12 LEAD, INITIAL
Atrial Rate: 86 {beats}/min
Calculated P Axis: 30 degrees
Calculated R Axis: 9 degrees
Calculated T Axis: 15 degrees
Diagnosis: NORMAL
P-R Interval: 192 ms
Q-T Interval: 364 ms
QRS Duration: 98 ms
QTC Calculation (Bezet): 435 ms
Ventricular Rate: 86 {beats}/min

## 2009-12-15 LAB — LDL, DIRECT: LDL,Direct: 48 mg/dl (ref 0–100)

## 2009-12-15 MED ORDER — ATORVASTATIN 40 MG TAB
40 mg | Freq: Every evening | ORAL | Status: DC
Start: 2009-12-15 — End: 2009-12-15

## 2009-12-15 MED FILL — NICOTINE 7 MG/24 HR DAILY PATCH: 7 mg/24 hr | TRANSDERMAL | Qty: 1

## 2009-12-15 MED FILL — LOVENOX 40 MG/0.4 ML SUBCUTANEOUS SYRINGE: 40 mg/0.4 mL | SUBCUTANEOUS | Qty: 0.4

## 2009-12-15 MED FILL — ONDANSETRON (PF) 4 MG/2 ML INJECTION: 4 mg/2 mL | INTRAMUSCULAR | Qty: 2

## 2009-12-15 MED FILL — HYDROMORPHONE (PF) 1 MG/ML IJ SOLN: 1 mg/mL | INTRAMUSCULAR | Qty: 2

## 2009-12-15 MED FILL — SODIUM CHLORIDE 0.9 % INJECTION: INTRAMUSCULAR | Qty: 10

## 2009-12-15 NOTE — Progress Notes (Signed)
Dr. Raiford Simmonds notified of amylase and lipase results.  Order given to start on full liquid and advance as tolerated.

## 2009-12-15 NOTE — Progress Notes (Signed)
Bedside repot was given to [employer], Rn in [employer], Kardex, and recent result. Patient is stable and resting comfortable.

## 2009-12-15 NOTE — Progress Notes (Signed)
Pt tolerated full liquid dinner tray.  Advanced to regular diet.  Denies pain, nausea/vomitting.

## 2009-12-15 NOTE — Progress Notes (Signed)
Hospitalist Progress Note    NAME:  Kevin Davenport   DOB:   08-Aug-1956   MRN:   [id number]     Date/Time:  12/15/2009         12:59 PM  Subjective:   Mr. Diesel seen without complaints. No abdominal pain no nausea or vomiting    Review of Systems:  Y             N        Y              N  []              [x]               Fever/chills                                               []              [x]               Chest Pain  []              [x]               Cough                                                       []              [x]               Diarrhea   []              [x]               Sputum                                                     []              [x]               Constipation  []              [x]               SOB/DOE                                                []              [x]               Nausea/Vomit  []              [x]               Abd Pain                                                    []              [  x]              Tolerating PT  []              [x]               Dysuria                                                      [x]              []               Tolerating Diet     []             Unable to obtain  ROS due to  []             mental status change  []             sedated   []             intubated     Past Med History and Social history reviewed. No changes.   [x]             Current Medication list and allergies reviewed*     Care Plan discussed with:    [x]             Patient   []             Family    []             Care Manager  [x]             Nursing   []             Consultant/Specialist :      []               >50% of visit spent in counseling and coordination of care   (Discussed []             CODE status,  []             Care Plan, []             D/C Planning)    Prophylaxis:  [x]             Lovenox  []             Coumadin  []             Hep SQ  []             SCD???s  [x]             H2B/PPI     Disposition:  [x]             Home w/ Family   []             HH PT,OT,RN   []             SNF/LTC   []             SAH/Rehab    Objective:   Vitals:  BP 127/87   Pulse 84   Temp 98.9 ??F (37.2 ??C)   Resp 18   Ht 5\' 9"    Wt 210 lb   BMI 31.01 kg/m2   SpO2 100%  Temp (24hrs), Avg:98.6 ??F (37 ??C), Min:97.9 ??F (36.6 ??C), Max:99.1 ??F (37.3 ??C)      Last 24hr Input/Output:  Intake/Output Summary (Last 24 hours) at 12/15/09 1259  Last data filed at 12/15/09 0957   Gross per 24 hour   Intake      0 ml   Output    125 ml   Net   -125 ml        PHYSICAL EXAM:  General:    Alert, cooperative, no distress, appears stated age.     HEENT: Atraumatic, anicteric sclerae, pink conjunctivae     No oral ulcers, mucosa moist, throat clear  Neck:  Supple, symmetrical,  thyroid: non tender  Lungs:   Clear to auscultation bilaterally.  No Wheezing or Rhonchi. No rales.  Chest wall:  No tenderness  No Accessory muscle use.  Heart:   Regular  rhythm,  No  murmur   No gallop.  No edema  Abdomen:   Soft, non-tender. Not distended.  Bowel sounds normal. No masses  Extremities: No cyanosis.  No clubbing  Skin:     Not pale Not Jaundiced  No rashes   Psych:  Good insight.  Not depressed.  Not anxious or agitated.  Neurologic: EOMs intact. No facial asymmetry. No aphasia or slurred speech.     Normal strength, Alert and oriented X 3.                 []             Telemetry Reviewed     []             NSR []             PAC/PVCs   []             Afib  []             Paced   []             NSVT   []             Foley []             NGT  []             Intubated on vent    Lab Data Reviewed:  No results found for this basename: GLPOC:5       Recent Labs   [facility] 12/15/09 0310 12/14/09 0330   ??? WBC 8.7 9.3   ??? HGB 13.1 14.6   ??? HCT 37.0 40.2   ??? PLT 163 192       Recent Labs   Basename 12/15/09 0310 12/14/09 0330   ??? NA 134* 133*   ??? K 3.4* 3.4*   ??? CL 101 101   ??? CO2 25 23   ??? BUN 7 15   ??? CREA 1.0 0.9   ??? GLU 116* 143*   ??? PHOS -- --   ??? CA 8.2* 8.2*    ??? ALB -- 2.9*        []               I have personally reviewed the   []             xray  []             CT scan    Medications reviewed:  Current facility-administered medications   Medication Dose Route Frequency   ??? atorvastatin (LIPITOR) tablet 40 mg  40 mg Oral QHS   ??? dextrose 5% and 0.9% NaCl infusion  100 mL/hr IntraVENous CONTINUOUS   ??? acetaminophen (TYLENOL) tablet 650 mg  650 mg Oral Q4H  PRN   ??? HYDROmorphone (PF) (DILAUDID) injection 2 mg  2 mg IntraVENous Q4H PRN   ??? naloxone (NARCAN) injection 0.4 mg  0.4 mg IntraVENous PRN   ??? acetaminophen (TYLENOL) tablet 650 mg  650 mg Oral Q4H PRN   ??? ondansetron (ZOFRAN) injection 4 mg  4 mg IntraVENous Q4H PRN   ??? nicotine (NICODERM CQ) 7 mg/24 hr patch 1 Patch  1 Patch TransDERmal Q24H   ??? lorazepam (ATIVAN) injection 0.26 mg  0.26 mg IntraVENous Q6H PRN   ??? enoxaparin (LOVENOX) injection 40 mg  40 mg SubCUTAneous DAILY   ??? pantoprazole (PROTONIX) injection 40 mg  40 mg IntraVENous DAILY        A/P  1. Acute pancreatitis may be due to hypertriglyceridemia, lipase down to normal, will advance diets tolerated  2. Triglyceridemia, start lopid,     Total time spent with patient: 25   minutes                Critical Care time:      _______________________________________________  Hospitalist: Erenest Rasher, MD

## 2009-12-16 LAB — METABOLIC PANEL, BASIC
Anion gap: 6 mmol/L (ref 5–15)
BUN/Creatinine ratio: 4 — ABNORMAL LOW (ref 12–20)
BUN: 4 MG/DL — ABNORMAL LOW (ref 6–20)
CO2: 28 MMOL/L (ref 21–32)
Calcium: 8.6 MG/DL (ref 8.5–10.1)
Chloride: 101 MMOL/L (ref 97–108)
Creatinine: 0.9 MG/DL (ref 0.6–1.3)
GFR est AA: >60 mL/min/{1.73_m2} (ref >60)
GFR est non-AA: >60 mL/min/{1.73_m2} (ref >60)
Glucose: 86 MG/DL (ref 65–100)
Potassium: 3 MMOL/L — ABNORMAL LOW (ref 3.5–5.1)
Sodium: 135 MMOL/L — ABNORMAL LOW (ref 136–145)

## 2009-12-16 LAB — CBC WITH AUTOMATED DIFF
ABS. BASOPHILS: 0 10*3/uL (ref 0.0–0.1)
ABS. EOSINOPHILS: 0.1 10*3/uL (ref 0.0–0.4)
ABS. LYMPHOCYTES: 1.2 10*3/uL (ref 0.8–3.5)
ABS. MONOCYTES: 0.6 10*3/uL (ref 0.0–1.0)
ABS. NEUTROPHILS: 7.3 10*3/uL (ref 1.8–8.0)
BASOPHILS: 0 % (ref 0–1)
EOSINOPHILS: 1 % (ref 0–7)
HCT: 34.3 % — ABNORMAL LOW (ref 36.6–50.3)
HGB: 12.2 g/dL (ref 12.1–17.0)
LYMPHOCYTES: 13 % (ref 12–49)
MCH: 31.4 PG (ref 26.0–34.0)
MCHC: 35.6 g/dL (ref 30.0–36.5)
MCV: 88.2 FL (ref 80.0–99.0)
MONOCYTES: 7 % (ref 5–13)
NEUTROPHILS: 79 % — ABNORMAL HIGH (ref 32–75)
PLATELET: 152 10*3/uL (ref 150–400)
RBC: 3.89 M/uL — ABNORMAL LOW (ref 4.10–5.70)
RDW: 14 % (ref 11.5–14.5)
WBC: 9.2 10*3/uL (ref 4.1–11.1)

## 2009-12-16 LAB — LIPASE: Lipase: 410 U/L — ABNORMAL HIGH (ref 73–393)

## 2009-12-16 LAB — AMYLASE: Amylase: 69 U/L (ref 25–115)

## 2009-12-16 MED ORDER — OMEPRAZOLE 20 MG CAP, DELAYED RELEASE
20 mg | ORAL_CAPSULE | Freq: Every day | ORAL | Status: DC
Start: 2009-12-16 — End: 2012-02-03

## 2009-12-16 MED ORDER — GEMFIBROZIL 600 MG TAB
600 mg | Freq: Two times a day (BID) | ORAL | Status: DC
Start: 2009-12-16 — End: 2009-12-16
  Administered 2009-12-16 (×2): via ORAL

## 2009-12-16 MED ORDER — POTASSIUM CHLORIDE SR 10 MEQ TAB
10 mEq | Freq: Every day | ORAL | Status: DC
Start: 2009-12-16 — End: 2009-12-16
  Administered 2009-12-16: 18:00:00 via ORAL

## 2009-12-16 MED ORDER — SODIUM CHLORIDE 0.9 % IJ SYRG
INTRAMUSCULAR | Status: AC
Start: 2009-12-16 — End: 2009-12-16
  Administered 2009-12-16: 04:00:00

## 2009-12-16 MED ORDER — DOCUSATE SODIUM 100 MG CAP
100 mg | Freq: Two times a day (BID) | ORAL | Status: DC
Start: 2009-12-16 — End: 2009-12-16
  Administered 2009-12-16: 13:00:00 via ORAL

## 2009-12-16 MED ORDER — GEMFIBROZIL 600 MG TAB
600 mg | ORAL_TABLET | Freq: Two times a day (BID) | ORAL | Status: DC
Start: 2009-12-16 — End: 2012-10-09

## 2009-12-16 MED ORDER — MAGNESIUM HYDROXIDE 400 MG/5 ML ORAL SUSP
400 mg/5 mL | Freq: Once | ORAL | Status: AC
Start: 2009-12-16 — End: 2009-12-16
  Administered 2009-12-16: 05:00:00 via ORAL

## 2009-12-16 MED FILL — NICOTINE 7 MG/24 HR DAILY PATCH: 7 mg/24 hr | TRANSDERMAL | Qty: 1

## 2009-12-16 MED FILL — GEMFIBROZIL 600 MG TAB: 600 mg | ORAL | Qty: 1

## 2009-12-16 MED FILL — SODIUM CHLORIDE 0.9 % INJECTION: INTRAMUSCULAR | Qty: 10

## 2009-12-16 MED FILL — MILK OF MAGNESIA 400 MG/5 ML ORAL SUSPENSION: 400 mg/5 mL | ORAL | Qty: 30

## 2009-12-16 MED FILL — SALINE FLUSH INJECTION SYRINGE: INTRAMUSCULAR | Qty: 10

## 2009-12-16 MED FILL — K-TAB 10 MEQ TABLET,EXTENDED RELEASE: 10 mEq | ORAL | Qty: 2

## 2009-12-16 MED FILL — LOVENOX 40 MG/0.4 ML SUBCUTANEOUS SYRINGE: 40 mg/0.4 mL | SUBCUTANEOUS | Qty: 0.4

## 2009-12-16 MED FILL — DOK 100 MG CAPSULE: 100 mg | ORAL | Qty: 1

## 2009-12-16 NOTE — Discharge Summary (Signed)
Physician Discharge Summary     Patient ID:    Kevin Davenport  [id number]  53 y.o.  [birth year]    Admit date:    12/14/2009  Discharge date and time:  12/16/2009    Admission Diagnoses: ACUTE PANCREATITIS  ________________________________________________________________________  Chronic Diagnoses:  Patient Active Problem List   Diagnoses Code   ??? Alcohol abuse, continuous 305.01   ??? Acute pancreatitis 577.0       ________________________________________________________________________  Discharge Medications: Current Discharge Medication List      START taking these medications       gemfibrozil (LOPID) 600 mg tablet    Take 1 Tab by mouth two (2) times a day (before meals).    Qty: 60 Tab Refills: 5        omeprazole (PRILOSEC) 20 mg capsule    Take 1 Cap by mouth daily.    Qty: 30 Cap Refills: 5               Follow up Care:    1. PCPin 1-2 weeks      Diet:  Low fat, Low cholesterol    Disposition:  Home.        CONSULTATIONS: none    Significant Diagnostic Studies:   Recent Labs   Basename 12/16/09 0400 12/15/09 0310   ??? WBC 9.2 8.7   ??? HGB 12.2 13.1   ??? HCT 34.3* 37.0   ??? PLT 152 163       Recent Labs   Basename 12/16/09 0400 12/15/09 0310 12/14/09 0330   ??? NA 135* 134* 133*   ??? K 3.0* 3.4* 3.4*   ??? CL 101 101 101   ??? CO2 28 25 23    ??? BUN 4* 7 15   ??? CREA 0.9 1.0 0.9   ??? GLU 86 116* 143*   ??? CA 8.6 8.2* 8.2*   ??? MG -- -- --   ??? PHOS -- -- --   ??? URICA -- -- --       Recent Labs   Basename 12/16/09 0400 12/15/09 1350 12/15/09 0310 12/14/09 0330   ??? SGOT -- -- -- 108*   ??? GPT -- -- -- 94*   ??? AP -- -- -- 126   ??? TBIL -- -- -- 0.5   ??? TP -- -- -- 7.2   ??? ALB -- -- -- 2.9*   ??? GLOB -- -- -- 4.3*   ??? GGT -- -- -- --   ??? AML 69 82 -- 179*   ??? LPSE 410* 345 927* --         No results found for this basename: INR:3,PTP:3,APTT:3, in the last 72 hours     No results found for this basename: FE:2,TIBC:2,PSAT:2,FERR:2, in the last 72 hours     No results found for this basename: PH:2,PCO2:2,PO2:2, in the last 72 hours     No results found for this basename: CPK:3,CKMB:3,TROPONINI:3, in the last 72 hours  No results found for this basename: GLPOC:5               1. * Cannot find OR case *     2. @PROCPERF @     3. Treatment Team: Attending Provider: Erenest Rasher, MD; Consulting Physician: Army Melia, MD     HOSPITAL COURSE & TREATMENT RENDERED:   Pt was admitted with diagnosis of acute pancreatitis. He was kept NPO and gradually diet was started while we d/c'ed IVF. At the time of this  note, pt is tolerating PO diet well. His lipase is near upper limit of normal. I proposed to the patient to stay in the hospital one more day, I am afraid he may not comply. Thus, reluctantly I am discharging the patient to go home with advise to establish and follow up with a PCP for further care. He will continue gemfibrozil and PPI PO.     His condition will be reported to me by nursing staff around 5pm. If he is stable he can be discharged to go home around 530 pm.             ________________________________________________________________________    Gabriel Carina, MD MPH  12/16/2009

## 2009-12-16 NOTE — Progress Notes (Signed)
Attended interdisciplinary team rounds involving discussion on patient's plan of care.    Vik Nambiar, M. Div., Chaplain

## 2009-12-16 NOTE — Other (Signed)
Interdisciplinary team rounds were held 12/16/2009 with the following team members:Care Management, Infection Control, Nursing, Occupational Therapy, Palliative Care, Pastoral Care, Pharmacy and Quality and the patient.  Plan of care discussed. See clinical pathway and/or care plan for interventions and desired outcomes. Patient states he is doing fine and would like to be discharged. BP elevated but at baseline for this patient. He denies distress.

## 2009-12-16 NOTE — Progress Notes (Signed)
Spiritual Care Assessment/Progress Notes    Kevin Davenport [id number]  [id number]    Mar 07, 1956  54 y.o.  male    Patient Telephone Number: [phone] (home)   Religious Affiliation: No religion   Language: English   Extended Emergency Contact Information  Primary Emergency Contact: Rini,James   [place]  Home Phone: [phone]  Relation: Parent   Patient Active Problem List   Diagnoses Date Noted   ??? Alcohol abuse, continuous [305.01] 12/14/2009   ??? Acute pancreatitis [577.0] 12/14/2009          Date: 12/16/2009       Level of Religious/Spiritual Activity:  [x]          Involved in faith tradition/spiritual practice    []          Not involved in faith tradition/spiritual practice  [x]          Spiritually oriented    []          Claims no spiritual orientation    []          seeking spiritual identity  []          Feels alienated from religious practice/tradition  []          Feels angry about religious practice/tradition  [x]          Spirituality/religious tradition is a Theatre stage manager for coping at this time.  []          Not able to assess due to medical condition    Services Provided Today:  []          crisis intervention    []          reading Scriptures  [x]          spiritual assessment    []          prayer  [x]          empathic listening/emotional support  []          rites and rituals (cite in comments)  []          life review     []          religious support  []          theological development   []          advocacy  []          ethical dialog     []          blessing  []          bereavement support    []          support to family  []          anticipatory grief support   []          help with AMD  []          spiritual guidance    []          meditation      Spiritual Care Needs  [x]          Emotional Support  []          Spiritual/Religious Care  []          Loss/Adjustment  []          Advocacy/Referral /Ethics  []          No needs expressed at this time   []          Other: (note in comments)  Spiritual Care Plan  []          Follow up visits with  pt/family  []          Provide materials  []          Schedule sacraments  []          Contact [facility]  [x]          Follow up as needed  []          Other: (note in comments)     Comments: Visited patient for an initial spiritual assessment. Patient indicated that he has good family support, has a wife and four children. He belongs to a [employer] and has good support from them. Offered pastoral support and assurance of prayer.    VIKRAM NAMBIAR   Visited by Chales Abrahams, Stephannie Li., Chaplain

## 2009-12-16 NOTE — Progress Notes (Signed)
Patient complained of constipation. Dr Raiford Simmonds notified and ordered Milk of Mag 30ml  PO once and Colace 100mg  PO BID.

## 2009-12-16 NOTE — Progress Notes (Signed)
Bedside and Verbal shift change report given to Melissa Gentry,RN (oncoming nurse) by [place],RN (offgoing nurse). Report given with SBAR, Kardex, Intake/Output and MAR.

## 2010-05-11 LAB — URINALYSIS W/ REFLEX CULTURE
Bilirubin: NEGATIVE
Glucose: NEGATIVE MG/DL
Ketone: NEGATIVE MG/DL
Leukocyte Esterase: NEGATIVE
Nitrites: NEGATIVE
Protein: 100 MG/DL — AB
RBC: >100 /HPF — ABNORMAL HIGH (ref 0–5)
Specific gravity: 1.015 (ref 1.003–1.030)
Urobilinogen: 0.2 EU/DL (ref 0.2–1.0)
pH (UA): 6.5 (ref 5.0–8.0)

## 2010-05-11 MED ORDER — SODIUM CHLORIDE 0.9 % IV
INTRAVENOUS | Status: DC
Start: 2010-05-11 — End: 2010-05-11
  Administered 2010-05-11: 05:00:00 via INTRAVENOUS

## 2010-05-11 MED ORDER — CIPROFLOXACIN 500 MG TAB
500 mg | ORAL_TABLET | Freq: Two times a day (BID) | ORAL | Status: AC
Start: 2010-05-11 — End: 2010-05-21

## 2010-05-11 MED ORDER — LEVOFLOXACIN IN D5W 500 MG/100 ML IV PIGGY BACK
500 mg/100 mL | INTRAVENOUS | Status: AC
Start: 2010-05-11 — End: 2010-05-11
  Administered 2010-05-11: 06:00:00 via INTRAVENOUS

## 2010-05-11 NOTE — ED Notes (Signed)
Noted patient's urine is grossly hematuria.

## 2010-05-11 NOTE — ED Notes (Signed)
IV fluids infusing without difficulty.

## 2010-05-11 NOTE — ED Notes (Signed)
Pt ambulatory to restroom with specimen cup in hand.

## 2010-05-11 NOTE — ED Provider Notes (Signed)
HPI Comments:  54 y/o bm with hx of pancreatitis admits to recent ETOH presents with complaint of right and left sided floank pain that radiates anteriorly. Pt denied diffuse abd pain, nausea or vomitting. Is concerned because he has noticed blood in his urine, 1st appeared 4 days ago and is getting worse with presence of clots.  Pt says this not the pain/discomfort he gets when his pancreas is bothering him.    Patient is a 54 y.o. male presenting with hematuria. The history is provided by the patient. No language interpreter was used.   Blood in Urine   This is a new problem. The current episode started more than 2 days ago. The problem occurs every urination. The problem has been gradually worsening. The pain is at a severity of 0/10. There has been no fever. There is no history of pyelonephritis. Associated symptoms include hematuria and flank pain. The patient is not pregnant.       Past Medical History   Diagnosis Date   ??? Other ill-defined conditions      Pancreatitis          No past surgical history on file.      No family history on file.     History   Social History   ??? Marital Status: Married     Spouse Name: N/A     Number of Children: N/A   ??? Years of Education: N/A   Occupational History   ??? Not on file.   Social History Main Topics   ??? Smoking status: Current Everyday Smoker   ??? Smokeless tobacco: Not on file   ??? Alcohol Use: Yes   ??? Drug Use: No   ??? Sexually Active:    Other Topics Concern   ??? Not on file   Social History Narrative   ??? No narrative on file                    ALLERGIES: Review of patient's allergies indicates no known allergies.      Review of Systems   Genitourinary: Positive for hematuria and flank pain.   All other systems reviewed and are negative.        Filed Vitals:    05/11/10 0023   BP: 136/89   Pulse: 99   Temp: 98.5 ??F (36.9 ??C)   Resp: 20   Height: 5' 8.5" (1.74 m)   Weight: 211 lb (95.709 kg)   SpO2: 96%              Physical Exam   Nursing note and vitals reviewed.   Constitutional: He is oriented to person, place, and time. He appears well-developed and well-nourished.   HENT:   Head: Normocephalic and atraumatic.   Right Ear: External ear normal.   Left Ear: External ear normal.   Mouth/Throat: Oropharynx is clear and moist. No oropharyngeal exudate.   Eyes: Conjunctivae and EOM are normal. Pupils are equal, round, and reactive to light. Right eye exhibits no discharge. Left eye exhibits no discharge. No scleral icterus.   Neck: Normal range of motion. Neck supple. No tracheal deviation present. No thyromegaly present.   Cardiovascular: Normal rate, regular rhythm, normal heart sounds and intact distal pulses.    No murmur heard.  Pulmonary/Chest: Effort normal and breath sounds normal. No respiratory distress. He has no wheezes. He has no rales.   Abdominal: Soft. Bowel sounds are normal. He exhibits no distension. Tenderness (bilateral flank pain) is present. He  has no rebound and no guarding.   Genitourinary: Penis normal.   Musculoskeletal: Normal range of motion. He exhibits no edema and no tenderness.   Lymphadenopathy:     He has no cervical adenopathy.   Neurological: He is alert and oriented to person, place, and time. No cranial nerve deficit. Coordination normal.   Skin: Skin is warm. No rash noted. No erythema.   Psychiatric: He has a normal mood and affect. His behavior is normal. Judgment and thought content normal.        MDM    Procedures

## 2010-05-11 NOTE — ED Notes (Signed)
Subjectively improved after IV hydration. Pt is almost completely gone. Results of CT discussed.

## 2010-05-11 NOTE — ED Notes (Signed)
The patient was discharged  home in stable condition, accompanied by self. The patient is alert and oriented, is in no respiratory distress and has vital signs within normal limits . The patient's diagnosis, condition and treatment were explained to patient. The patient/responsible party expressed verbal understanding and had opportunity to ask questions. One prescription given to pt. No work/school note given to pt. A discharge plan has been developed. A case manager was not involved in the process. Aftercare instructions were given to the patient.

## 2010-05-11 NOTE — ED Notes (Signed)
Pt resting with eyes closed.

## 2010-05-11 NOTE — ED Notes (Signed)
Pt reports blood in urine for couple of days. Denies Nausea, vomiting, or diarrhea. Pt does report LLQ and RLQ pain. Described as sharp and radiates from LLQ to RLQ.

## 2010-05-11 NOTE — ED Notes (Signed)
Pt reports consuming beer and liquor today. Pt unable to state quantity.

## 2010-05-12 LAB — CULTURE, URINE
Colonies Counted: <1000
Colony Count: <1000
Culture result:: NO GROWTH
Culture: NO GROWTH

## 2012-01-03 MED ORDER — AMOXICILLIN 500 MG CAP
500 mg | ORAL_CAPSULE | Freq: Three times a day (TID) | ORAL | Status: AC
Start: 2012-01-03 — End: 2012-01-13

## 2012-01-03 NOTE — ED Provider Notes (Signed)
Patient is a 56 y.o. male presenting with nasal congestion. The history is provided by the patient. No language interpreter was used.   Nasal Congestion  This is a new (with productive cough of yellow sputum) problem. The current episode started more than 1 week ago. The problem occurs daily. The problem has not changed since onset.Pertinent negatives include no chest pain and no shortness of breath. Nothing aggravates the symptoms. Nothing relieves the symptoms. He has tried nothing for the symptoms.        Past Medical History   Diagnosis Date   ??? Other ill-defined conditions      Pancreatitis        History reviewed. No pertinent past surgical history.      No family history on file.     History     Social History   ??? Marital Status: MARRIED     Spouse Name: N/A     Number of Children: N/A   ??? Years of Education: N/A     Occupational History   ??? Not on file.     Social History Main Topics   ??? Smoking status: Current Everyday Smoker -- 0.5 packs/day   ??? Smokeless tobacco: Not on file   ??? Alcohol Use: Yes   ??? Drug Use: No   ??? Sexually Active:      Other Topics Concern   ??? Not on file     Social History Narrative   ??? No narrative on file                  ALLERGIES: Review of patient's allergies indicates no known allergies.      Review of Systems   Constitutional: Negative for fever.   HENT: Positive for congestion, rhinorrhea and postnasal drip.    Respiratory: Positive for cough. Negative for shortness of breath and wheezing.    Cardiovascular: Negative for chest pain.   All other systems reviewed and are negative.        Filed Vitals:    01/03/12 1026   BP: 162/99   Pulse: 86   Temp: 98.3 ??F (36.8 ??C)   Resp: 16   Height: 5\' 8"  (1.727 m)   Weight: 89.994 kg (198 lb 6.4 oz)   SpO2: 98%            Physical Exam   Nursing note and vitals reviewed.  Constitutional: He is oriented to person, place, and time. He appears well-developed and well-nourished. No distress.   HENT:   Head: Normocephalic.   Eyes: EOM are  normal.   Neck: Normal range of motion. Neck supple.   Cardiovascular: Normal rate, regular rhythm and normal heart sounds.  Exam reveals no gallop.    No murmur heard.  Pulmonary/Chest: Effort normal and breath sounds normal. No respiratory distress. He has no wheezes. He has no rales.   Abdominal: Soft. Bowel sounds are normal.   Musculoskeletal: Normal range of motion. He exhibits no edema and no tenderness.   Neurological: He is alert and oriented to person, place, and time.        Grossly intact without focal deficits      Skin: Skin is warm and dry.   Psychiatric: He has a normal mood and affect. His behavior is normal.        MDM    Procedures    Pt. Advised of A/P and the need to follow-up with their PCP or assigned referral physician

## 2012-01-03 NOTE — ED Notes (Signed)
Patient given copy of dc instructions and 1 script(s).  Patient verbalized understanding of instructions and script (s).  Patient given a current medication reconciliation form and verbalized understanding of their medications.   Patient  verbalized understanding of the importance of discussing medications with  his or her physician or clinic they will be following up with.  Patient alert and oriented and in no acute distress.  Patient discharged home ambulatory .

## 2012-02-01 ENCOUNTER — Inpatient Hospital Stay
Admit: 2012-02-01 | Discharge: 2012-02-03 | Disposition: A | Discharge status: Home / Self Care | Payer: PRIVATE HEALTH INSURANCE | Attending: Internal Medicine | Admitting: Internal Medicine

## 2012-02-01 DIAGNOSIS — K859 Acute pancreatitis without necrosis or infection, unspecified: Secondary | ICD-10-CM

## 2012-02-01 LAB — URINALYSIS W/ REFLEX CULTURE
Bacteria: NEGATIVE /HPF
Bilirubin: NEGATIVE
Blood: NEGATIVE
Glucose: NEGATIVE MG/DL
Ketone: NEGATIVE MG/DL
Nitrites: NEGATIVE
Protein: 30 MG/DL — AB
Specific gravity: 1.028 (ref 1.003–1.030)
Urobilinogen: 0.2 EU/DL (ref 0.2–1.0)
pH (UA): 6 (ref 5.0–8.0)

## 2012-02-01 LAB — METABOLIC PANEL, COMPREHENSIVE
A-G Ratio: 0.8 — ABNORMAL LOW (ref 1.1–2.2)
ALT (SGPT): 68 U/L (ref 12–78)
AST (SGOT): 48 U/L — ABNORMAL HIGH (ref 15–37)
Albumin: 3.4 g/dL — ABNORMAL LOW (ref 3.5–5.0)
Alk. phosphatase: 109 U/L (ref 50–136)
Anion gap: 11 mmol/L (ref 5–15)
BUN/Creatinine ratio: 14 (ref 12–20)
BUN: 12 MG/DL (ref 6–20)
Bilirubin, total: 0.4 MG/DL (ref 0.2–1.0)
CO2: 26 MMOL/L (ref 21–32)
Calcium: 8.4 MG/DL — ABNORMAL LOW (ref 8.5–10.1)
Chloride: 104 MMOL/L (ref 97–108)
Creatinine: 0.86 MG/DL (ref 0.45–1.15)
GFR est AA: >60 mL/min/{1.73_m2} (ref >60)
GFR est non-AA: >60 mL/min/{1.73_m2} (ref >60)
Globulin: 4.5 g/dL — ABNORMAL HIGH (ref 2.0–4.0)
Glucose: 98 MG/DL (ref 65–100)
Potassium: 3.6 MMOL/L (ref 3.5–5.1)
Protein, total: 7.9 g/dL (ref 6.4–8.2)
Sodium: 141 MMOL/L (ref 136–145)

## 2012-02-01 LAB — CBC WITH AUTOMATED DIFF
ABS. BASOPHILS: 0.1 10*3/uL (ref 0.0–0.1)
ABS. EOSINOPHILS: 0.1 10*3/uL (ref 0.0–0.4)
ABS. LYMPHOCYTES: 2.3 10*3/uL (ref 0.8–3.5)
ABS. MONOCYTES: 0.6 10*3/uL (ref 0.0–1.0)
ABS. NEUTROPHILS: 5.3 10*3/uL (ref 1.8–8.0)
BASOPHILS: 1 % (ref 0–1)
EOSINOPHILS: 1 % (ref 0–7)
HCT: 41.6 % (ref 36.6–50.3)
HGB: 15.1 g/dL (ref 12.1–17.0)
LYMPHOCYTES: 28 % (ref 12–49)
MCH: 32 PG (ref 26.0–34.0)
MCHC: 36.3 g/dL (ref 30.0–36.5)
MCV: 88.1 FL (ref 80.0–99.0)
MONOCYTES: 7 % (ref 5–13)
NEUTROPHILS: 63 % (ref 32–75)
PLATELET: 222 10*3/uL (ref 150–400)
RBC: 4.72 M/uL (ref 4.10–5.70)
RDW: 14.1 % (ref 11.5–14.5)
WBC: 8.3 10*3/uL (ref 4.1–11.1)

## 2012-02-01 LAB — LIPASE: Lipase: 396 U/L — ABNORMAL HIGH (ref 73–393)

## 2012-02-01 LAB — ETHYL ALCOHOL: ALCOHOL(ETHYL),SERUM: 135 MG/DL — ABNORMAL HIGH (ref <10)

## 2012-02-01 MED ORDER — ONDANSETRON (PF) 4 MG/2 ML INJECTION
4 mg/2 mL | INTRAMUSCULAR | Status: AC
Start: 2012-02-01 — End: 2012-02-01
  Administered 2012-02-01: 22:00:00 via INTRAVENOUS

## 2012-02-01 MED ORDER — MORPHINE 2 MG/ML INJECTION
2 mg/mL | INTRAMUSCULAR | Status: AC
Start: 2012-02-01 — End: 2012-02-01
  Administered 2012-02-01: 22:00:00 via INTRAVENOUS

## 2012-02-01 MED ORDER — SODIUM CHLORIDE 0.9 % IJ SYRG
Freq: Once | INTRAMUSCULAR | Status: AC
Start: 2012-02-01 — End: 2012-02-01
  Administered 2012-02-01: 23:00:00 via INTRAVENOUS

## 2012-02-01 MED ORDER — FAMOTIDINE (PF) 20 MG/2 ML IV
20 mg/2 mL | INTRAVENOUS | Status: AC
Start: 2012-02-01 — End: 2012-02-01
  Administered 2012-02-01: 22:00:00 via INTRAVENOUS

## 2012-02-01 MED ORDER — THIAMINE 100 MG/ML INJECTION
100 mg/mL | INTRAMUSCULAR | Status: AC
Start: 2012-02-01 — End: 2012-02-01
  Administered 2012-02-01: 23:00:00 via INTRAVENOUS

## 2012-02-01 MED ORDER — IOVERSOL 350 MG/ML IV SOLN
350 mg iodine/mL | Freq: Once | INTRAVENOUS | Status: AC
Start: 2012-02-01 — End: 2012-02-01
  Administered 2012-02-01: 23:00:00 via INTRAVENOUS

## 2012-02-01 MED ORDER — SODIUM CHLORIDE 0.9% BOLUS IV
0.9 % | INTRAVENOUS | Status: AC
Start: 2012-02-01 — End: 2012-02-01
  Administered 2012-02-01: 22:00:00 via INTRAVENOUS

## 2012-02-01 MED ORDER — SODIUM CHLORIDE 0.9 % IJ SYRG
INTRAMUSCULAR | Status: DC | PRN
Start: 2012-02-01 — End: 2012-02-01
  Administered 2012-02-02: via INTRAVENOUS

## 2012-02-01 MED ORDER — SODIUM CHLORIDE 0.9 % IJ SYRG
Freq: Three times a day (TID) | INTRAMUSCULAR | Status: DC
Start: 2012-02-01 — End: 2012-02-03
  Administered 2012-02-02 – 2012-02-03 (×3): via INTRAVENOUS

## 2012-02-01 MED ORDER — DIATRIZOATE MEGLUMINE & SODIUM 66 %-10 % ORAL SOLN
66-10 % | ORAL | Status: AC
Start: 2012-02-01 — End: 2012-02-01
  Administered 2012-02-01: 22:00:00 via ORAL

## 2012-02-01 MED FILL — SODIUM CHLORIDE 0.9 % IV: INTRAVENOUS | Qty: 1000

## 2012-02-01 MED FILL — ONDANSETRON (PF) 4 MG/2 ML INJECTION: 4 mg/2 mL | INTRAMUSCULAR | Qty: 2

## 2012-02-01 MED FILL — FAMOTIDINE (PF) 20 MG/2 ML IV: 20 mg/2 mL | INTRAVENOUS | Qty: 2

## 2012-02-01 MED FILL — MD-GASTROVIEW 66 %-10 % ORAL SOLUTION: 66-10 % | ORAL | Qty: 30

## 2012-02-01 MED FILL — SALINE FLUSH INJECTION SYRINGE: INTRAMUSCULAR | Qty: 10

## 2012-02-01 MED FILL — MORPHINE 2 MG/ML INJECTION: 2 mg/mL | INTRAMUSCULAR | Qty: 2

## 2012-02-01 NOTE — ED Notes (Signed)
Pt started drinking oral gastroview at 1755.

## 2012-02-01 NOTE — ED Notes (Signed)
Pt resting in room in NAD watching tv.

## 2012-02-01 NOTE — ED Notes (Signed)
Pt resting in room in NAD.  Awaiting admission bed.

## 2012-02-01 NOTE — Other (Signed)
TRANSFER - OUT REPORT:    Verbal report given to Angelique Blonder, LPN(name) on Kevin Davenport  being transferred to medical(unit) for routine progression of care       Report consisted of patient???s Situation, Background, Assessment and   Recommendations(SBAR).     Information from the following report(s) SBAR, ED Summary, [facility] and Recent Results was reviewed with the receiving nurse.    Opportunity for questions and clarification was provided.      Pt brought upstairs via stretcher by RN.

## 2012-02-01 NOTE — ED Notes (Signed)
Dr. Ivin Poot updated about pt's BP and request for something to drink.

## 2012-02-01 NOTE — ED Provider Notes (Signed)
HPI Comments: 56 year old male states that he has had abdominal pain for 2 days worse wit eating or drinking states nausea and vomiting and diarrehea states chills no fevers noted states h/o pancreatitis states that he cotninues to drink alcohol states drinks about 24 pack of beer a day no h/o withdraw seizures.      Patient is a 56 y.o. male presenting with abdominal pain. The history is provided by the patient.   Abdominal Pain   This is a new problem. The current episode started 2 days ago. The problem occurs constantly. The problem has been gradually worsening. The pain is associated with eating. The pain is located in the epigastric region, RUQ and RLQ. The quality of the pain is aching and cramping. The pain is at a severity of 6/10. The pain is moderate. Associated symptoms include diarrhea, nausea and vomiting. Pertinent negatives include no hematochezia, no melena, no constipation, no dysuria, no frequency, no hematuria, no headaches, no arthralgias, no myalgias, no chest pain and no back pain. Nothing worsens the pain. The pain is relieved by nothing. His past medical history is significant for pancreatitis.        Past Medical History   Diagnosis Date   ??? Other ill-defined conditions      Pancreatitis        No past surgical history on file.      No family history on file.     History     Social History   ??? Marital Status: MARRIED     Spouse Name: N/A     Number of Children: N/A   ??? Years of Education: N/A     Occupational History   ??? Not on file.     Social History Main Topics   ??? Smoking status: Current Everyday Smoker -- 0.5 packs/day   ??? Smokeless tobacco: Not on file   ??? Alcohol Use: Yes   ??? Drug Use: No   ??? Sexually Active:      Other Topics Concern   ??? Not on file     Social History Narrative   ??? No narrative on file                  ALLERGIES: Review of patient's allergies indicates no known allergies.      Review of Systems   Constitutional: Positive for chills. Negative for appetite change and  unexpected weight change.   HENT: Negative for facial swelling, rhinorrhea, trouble swallowing, neck pain, dental problem and ear discharge.    Eyes: Negative for pain and discharge.   Respiratory: Negative for apnea and stridor.    Cardiovascular: Negative for chest pain, palpitations and leg swelling.   Gastrointestinal: Positive for nausea, vomiting, abdominal pain and diarrhea. Negative for constipation, blood in stool, melena, hematochezia and abdominal distention.   Genitourinary: Negative for dysuria, frequency, hematuria, flank pain and difficulty urinating.   Musculoskeletal: Negative for myalgias, back pain, joint swelling and arthralgias.   Skin: Negative for color change, rash and wound.   Neurological: Negative for facial asymmetry, speech difficulty, numbness and headaches.   Hematological: Negative for adenopathy.   Psychiatric/Behavioral: Negative for suicidal ideas, hallucinations, behavioral problems, self-injury and agitation.       Filed Vitals:    02/01/12 1631   BP: 161/96   Pulse: 91   Temp: 98.7 ??F (37.1 ??C)   Resp: 14   Height: 5\' 8"  (1.727 m)   Weight: 89.415 kg (197 lb 2 oz)  SpO2: 97%            Physical Exam   Nursing note and vitals reviewed.  Constitutional: He is oriented to person, place, and time. He appears well-developed and well-nourished. He appears distressed.   HENT:   Head: Normocephalic and atraumatic.   Mouth/Throat: Oropharynx is clear and moist. No oropharyngeal exudate.   Eyes: Conjunctivae and EOM are normal. Pupils are equal, round, and reactive to light. Right eye exhibits no discharge. Left eye exhibits no discharge. Scleral icterus is present.   Neck: Normal range of motion. Neck supple.   Cardiovascular: Normal rate, regular rhythm, normal heart sounds and intact distal pulses.    Pulmonary/Chest: Effort normal and breath sounds normal. No respiratory distress. He has no wheezes.   Abdominal: Soft. Bowel sounds are normal. He exhibits no distension. There is  tenderness in the right upper quadrant and right lower quadrant. There is guarding. There is no rebound.   Musculoskeletal: Normal range of motion. He exhibits no edema and no tenderness.   Neurological: He is alert and oriented to person, place, and time. No cranial nerve deficit. Coordination normal.   Skin: Skin is warm. No rash noted. No erythema.        MDM     Differential Diagnosis; Clinical Impression; Plan:     DDX: pancreatitis, gall stone pancreatitis, chole, appy, gastroenteritis, dehydration, liver disease, electrolyte abnormality    Amount and/or Complexity of Data Reviewed:   Clinical lab tests:  Ordered and reviewed  Tests in the radiology section of CPT??:  Ordered and reviewed  Discussion of test results with the performing providers:  Yes  Risk of Significant Complications, Morbidity, and/or Mortality:   Presenting problems:  Moderate  Diagnostic procedures:  Moderate  Management options:  Moderate      Procedures    Discussed with patient plan of action understands CT scan concerned of alcoholism and will start a goody bag in the department wife states that he does get shaky when he  doesn't drink but no h/o seizures

## 2012-02-01 NOTE — ED Notes (Signed)
Pt gone to CT at this time.

## 2012-02-01 NOTE — ED Notes (Signed)
Case discussed with Dr.  Caleen Essex

## 2012-02-02 LAB — METABOLIC PANEL, BASIC
Anion gap: 11 mmol/L (ref 5–15)
BUN/Creatinine ratio: 11 — ABNORMAL LOW (ref 12–20)
BUN: 8 MG/DL (ref 6–20)
CO2: 25 MMOL/L (ref 21–32)
Calcium: 8 MG/DL — ABNORMAL LOW (ref 8.5–10.1)
Chloride: 103 MMOL/L (ref 97–108)
Creatinine: 0.72 MG/DL (ref 0.45–1.15)
GFR est AA: >60 mL/min/{1.73_m2} (ref >60)
GFR est non-AA: >60 mL/min/{1.73_m2} (ref >60)
Glucose: 73 MG/DL (ref 65–100)
Potassium: 3.6 MMOL/L (ref 3.5–5.1)
Sodium: 139 MMOL/L (ref 136–145)

## 2012-02-02 LAB — CBC W/O DIFF
HCT: 40.1 % (ref 36.6–50.3)
HGB: 14.4 g/dL (ref 12.1–17.0)
MCH: 31.7 PG (ref 26.0–34.0)
MCHC: 35.9 g/dL (ref 30.0–36.5)
MCV: 88.3 FL (ref 80.0–99.0)
PLATELET: 209 10*3/uL (ref 150–400)
RBC: 4.54 M/uL (ref 4.10–5.70)
RDW: 13.9 % (ref 11.5–14.5)
WBC: 8.3 10*3/uL (ref 4.1–11.1)

## 2012-02-02 MED ORDER — SODIUM CHLORIDE 0.9 % IV
Freq: Once | INTRAVENOUS | Status: DC
Start: 2012-02-02 — End: 2012-02-02
  Administered 2012-02-02: 03:00:00 via INTRAVENOUS

## 2012-02-02 MED ORDER — SODIUM CHLORIDE 0.9 % IJ SYRG
Freq: Three times a day (TID) | INTRAMUSCULAR | Status: DC
Start: 2012-02-02 — End: 2012-02-03
  Administered 2012-02-02 – 2012-02-03 (×4): via INTRAVENOUS

## 2012-02-02 MED ORDER — MVI, ADULT NO.1 WITH VIT K 1 MG-5 MCG-10 MG-200MG-150MCG IV
3300 unit- 150 mcg/10 mL | Freq: Every day | INTRAVENOUS | Status: DC
Start: 2012-02-02 — End: 2012-02-02
  Administered 2012-02-02: 21:00:00 via INTRAVENOUS

## 2012-02-02 MED ORDER — PANTOPRAZOLE 40 MG IV SOLR
40 mg | INTRAVENOUS | Status: DC
Start: 2012-02-02 — End: 2012-02-03
  Administered 2012-02-02 – 2012-02-03 (×3): via INTRAVENOUS

## 2012-02-02 MED ORDER — LORAZEPAM 2 MG/ML IJ SOLN
2 mg/mL | Freq: Four times a day (QID) | INTRAMUSCULAR | Status: DC | PRN
Start: 2012-02-02 — End: 2012-02-03

## 2012-02-02 MED ORDER — THIAMINE 100 MG/ML INJECTION
100 mg/mL | INTRAMUSCULAR | Status: DC
Start: 2012-02-02 — End: 2012-02-03
  Administered 2012-02-02: 21:00:00 via INTRAVENOUS

## 2012-02-02 MED ORDER — NALOXONE 0.4 MG/ML INJECTION
0.4 mg/mL | INTRAMUSCULAR | Status: DC | PRN
Start: 2012-02-02 — End: 2012-02-03

## 2012-02-02 MED ORDER — ONDANSETRON (PF) 4 MG/2 ML INJECTION
4 mg/2 mL | INTRAMUSCULAR | Status: DC | PRN
Start: 2012-02-02 — End: 2012-02-03

## 2012-02-02 MED ORDER — DOCUSATE SODIUM 100 MG CAP
100 mg | Freq: Every day | ORAL | Status: DC | PRN
Start: 2012-02-02 — End: 2012-02-03

## 2012-02-02 MED ORDER — ACETAMINOPHEN 325 MG TABLET
325 mg | ORAL | Status: DC | PRN
Start: 2012-02-02 — End: 2012-02-03

## 2012-02-02 MED ORDER — HYDROCODONE-ACETAMINOPHEN 5 MG-325 MG TAB
5-325 mg | ORAL | Status: DC | PRN
Start: 2012-02-02 — End: 2012-02-03

## 2012-02-02 MED ORDER — MAGNESIUM HYDROXIDE 400 MG/5 ML ORAL SUSP
400 mg/5 mL | Freq: Every day | ORAL | Status: DC | PRN
Start: 2012-02-02 — End: 2012-02-03

## 2012-02-02 MED ORDER — LORAZEPAM 2 MG/ML IJ SOLN
2 mg/mL | Freq: Two times a day (BID) | INTRAMUSCULAR | Status: DC
Start: 2012-02-02 — End: 2012-02-02
  Administered 2012-02-02: 13:00:00 via INTRAVENOUS

## 2012-02-02 MED ORDER — CLONIDINE 0.1 MG TAB
0.1 mg | ORAL | Status: DC | PRN
Start: 2012-02-02 — End: 2012-02-03
  Administered 2012-02-02 – 2012-02-03 (×3): via ORAL

## 2012-02-02 MED ORDER — ENOXAPARIN 40 MG/0.4 ML SUB-Q SYRINGE
40 mg/0.4 mL | Freq: Every day | SUBCUTANEOUS | Status: DC
Start: 2012-02-02 — End: 2012-02-03
  Administered 2012-02-02 – 2012-02-03 (×2): via SUBCUTANEOUS

## 2012-02-02 MED ORDER — MORPHINE 2 MG/ML INJECTION
2 mg/mL | INTRAMUSCULAR | Status: DC | PRN
Start: 2012-02-02 — End: 2012-02-03
  Administered 2012-02-02: 09:00:00 via INTRAVENOUS

## 2012-02-02 MED ORDER — NICOTINE 14 MG/24 HR DAILY PATCH
14 mg/24 hr | Freq: Every day | TRANSDERMAL | Status: DC | PRN
Start: 2012-02-02 — End: 2012-02-03

## 2012-02-02 MED ORDER — DIPHENHYDRAMINE HCL 50 MG/ML IJ SOLN
50 mg/mL | INTRAMUSCULAR | Status: DC | PRN
Start: 2012-02-02 — End: 2012-02-03

## 2012-02-02 MED ORDER — SODIUM CHLORIDE 0.9 % IJ SYRG
INTRAMUSCULAR | Status: DC | PRN
Start: 2012-02-02 — End: 2012-02-03

## 2012-02-02 MED ORDER — ZOLPIDEM 5 MG TAB
5 mg | Freq: Every evening | ORAL | Status: DC | PRN
Start: 2012-02-02 — End: 2012-02-03

## 2012-02-02 MED ORDER — LORAZEPAM 2 MG/ML IJ SOLN
2 mg/mL | INTRAMUSCULAR | Status: DC | PRN
Start: 2012-02-02 — End: 2012-02-03

## 2012-02-02 MED FILL — SODIUM CHLORIDE 0.9 % IV: INTRAVENOUS | Qty: 1000

## 2012-02-02 MED FILL — LOVENOX 40 MG/0.4 ML SUBCUTANEOUS SYRINGE: 40 mg/0.4 mL | SUBCUTANEOUS | Qty: 0.4

## 2012-02-02 MED FILL — SODIUM CHLORIDE 0.9 % INJECTION: INTRAMUSCULAR | Qty: 10

## 2012-02-02 MED FILL — LORAZEPAM 2 MG/ML IJ SOLN: 2 mg/mL | INTRAMUSCULAR | Qty: 1

## 2012-02-02 MED FILL — SALINE FLUSH INJECTION SYRINGE: INTRAMUSCULAR | Qty: 10

## 2012-02-02 MED FILL — MORPHINE 10 MG/ML SYRINGE: 10 mg/mL | INTRAMUSCULAR | Qty: 1

## 2012-02-02 MED FILL — CLONIDINE 0.1 MG TAB: 0.1 mg | ORAL | Qty: 1

## 2012-02-02 NOTE — Progress Notes (Signed)
Problem: Mobility Impaired (Adult and Pediatric)  Goal: *Acute Goals and Plan of Care (Insert Text)  PHYSICAL THERAPY EVALUATION/DISCHARGE  Patient: Kevin Davenport (56 y.o. male)  Date: 02/02/2012  Primary Diagnosis: pancreatitis        Precautions: none     ASSESSMENT :   Based on the objective data described below, the patient presents at baseline for mobility as pt is independent with bed mobility, transfers and gait without need for assistive device. Pt denies dizziness or current pain with noted decrease in BP since this AM;currently 143/54mmHg at last reading. Standing static and dynamic balance is intact. Do not expect pt to need additional therapy upon discharge    Skilled physical therapy is not indicated at this time.       PLAN :   Discharge Recommendations: None  Further Equipment Recommendations for Discharge: none       SUBJECTIVE:   Patient stated ???I am feeling better than I was.???      OBJECTIVE DATA SUMMARY:       Past Medical History   Diagnosis Date   ??? Other ill-defined conditions         Pancreatitis   History reviewed. No pertinent past surgical history.  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  Home Environment: Private residence  One/Two Story Residence: One story  Living Alone: No  Support Systems: Spouse  Patient Expects to be Discharged to:: Private residence  Current DME Used/Available at Home: None  Critical Behavior:      calm and cooroporative        Skin:  Appears intact  Strength:    Strength: Within functional limits   UE: shoulder flexion 4/5 grip intact  LE: hip flexion 3+/5, knee extension 4+/5 ,knee flexion 4+/5 DF 5/5  Tone & Sensation:   Tone: Normal        Sensation: Intact to light touch        Range Of Motion:  AROM: Within functional limits        Coordination:  Coordination: Within functional limits    Functional Mobility:  Bed Mobility:  Rolling: Independent  Supine to Sit: Independent  Sit to Supine:  Independent  Scooting: Independent  Transfers:  Sit to Stand: Independent  Stand to Sit: Independent         Balance:   Sitting: Intact  Standing: Intact  Ambulation/Gait Training:     Gait Description (WDL): Within defined limits                         Therapeutic Exercises:   Discussed methods to maintain mobility while in hospital to include ambulation in room and in hallways per RN discretion.  Pain:  Pain Scale 1: Numeric (0 - 10)  Pain Intensity 1: 6  Pain Location 1: Abdomen  Activity Tolerance:   Good tolerance- currently 143/30mmHg HR 90bpm  Please refer to the flowsheet for vital signs taken during this treatment.  After treatment:   [ ]    Patient left in no apparent distress sitting up in chair  [X]    Patient left in no apparent distress in bed  [X]    Call bell left within reach  [X]    Nursing notified  [ ]    Caregiver present  [ ]    Bed alarm activated      COMMUNICATION/EDUCATION:   Communication/Collaboration:  [X]    Fall prevention education was provided and the  patient/caregiver indicated understanding.  [X]    Patient/family have participated as able and agree with findings and recommendations.  [ ]    Patient is unable to participate in plan of care at this time.  Findings and recommendations were discussed with: Physical Therapist and Registered Nurse    Thank you for this referral.  Ebbie Ridge, PT, DPT   Time Calculation: 16 mins

## 2012-02-02 NOTE — H&P (Signed)
History & Physical Dictation Template      NAME: Kevin Davenport   Date of Service  02/02/2012   MRN  [id number]   Date of Birth 1956-02-19   AGE: 56 y.o.   ROOM: 225/01     HPI  The patient Kevin Davenport is a 56 y.o. male that is admitted with PANCREATITIS.  PT + ABD PAIN STARTED YESTERDAY.  + N/V + F/C.  NO CP/SOA OR BURNING/BLOOD IN URINE.  + STILL DRINKING.  THIS HAPPENED LAST YEAR.  THE PATIENT WAS SEEN VIA REMOTE PRESENCE WITH RN PRESENT.    ASSESSMENT/ PLAN      Alcohol abuse, continuous (12/14/2009)     Acute pancreatitis (12/14/2009)-IVF/NPO/IV PAIN MEDS.  RECHECK LABS     Abdominal  pain, other specified site (02/02/2012)  HTN MALIGNANT PROBABLY 2 PAIN       Chart reviewed   Pt seen and examined   Please see full dictation for additional        BMI: Body mass index is 30.50 kg/(m^2).  Visit Vitals   Item Reading   ??? BP 184/119   ??? Pulse 76   ??? Temp 97.7 ??F (36.5 ??C)   ??? Resp 16   ??? Ht 5\' 8"  (1.727 m)   ??? Wt 200 lb 9.6 oz   ??? BMI 30.50 kg/m2   ??? SpO2 97%       Pertinent Physical Exam Findings: NONE  ________________________________________________________________________  family history includes Hypertension in his father and mother.  ________________________________________________________________________  Present on Admission:   ???Alcohol abuse, continuous  ???Acute pancreatitis  ???Abdominal  pain, other specified site  ________________________________________________________________________  ________________________________________________________________________  Recent Results (from the past 24 hour(s))   CBC WITH AUTOMATED DIFF    Collection Time    02/01/12  5:15 PM       Component Value Range    WBC 8.3  4.1 - 11.1 K/uL    RBC 4.72  4.10 - 5.70 M/uL    HGB 15.1  12.1 - 17.0 g/dL    HCT 40.9  81.1 - 91.4 %    MCV 88.1  80.0 - 99.0 FL    MCH 32.0  26.0 - 34.0 PG    MCHC 36.3  30.0 - 36.5 g/dL    RDW 78.2  95.6 - 21.3 %    PLATELET 222  150 - 400 K/uL    NEUTROPHILS 63  32 - 75 %    LYMPHOCYTES 28  12 - 49 %     MONOCYTES 7  5 - 13 %    EOSINOPHILS 1  0 - 7 %    BASOPHILS 1  0 - 1 %    ABS. NEUTROPHILS 5.3  1.8 - 8.0 K/UL    ABS. LYMPHOCYTES 2.3  0.8 - 3.5 K/UL    ABS. MONOCYTES 0.6  0.0 - 1.0 K/UL    ABS. EOSINOPHILS 0.1  0.0 - 0.4 K/UL    ABS. BASOPHILS 0.1  0.0 - 0.1 K/UL   METABOLIC PANEL, COMPREHENSIVE    Collection Time    02/01/12  5:15 PM       Component Value Range    Sodium 141  136 - 145 MMOL/L    Potassium 3.6  3.5 - 5.1 MMOL/L    Chloride 104  97 - 108 MMOL/L    CO2 26  21 - 32 MMOL/L    Anion gap 11  5 - 15 mmol/L    Glucose 98  65 - 100  MG/DL    BUN 12  6 - 20 MG/DL    Creatinine 9.60  4.54 - 1.15 MG/DL    BUN/Creatinine ratio 14  12 - 20      GFR est AA >60  >60 ml/min/1.57m2    GFR est non-AA >60  >60 ml/min/1.64m2    Calcium 8.4 (*) 8.5 - 10.1 MG/DL    Bilirubin, total 0.4  0.2 - 1.0 MG/DL    ALT 68  12 - 78 U/L    AST 48 (*) 15 - 37 U/L    Alk. phosphatase 109  50 - 136 U/L    Protein, total 7.9  6.4 - 8.2 g/dL    Albumin 3.4 (*) 3.5 - 5.0 g/dL    Globulin 4.5 (*) 2.0 - 4.0 g/dL    A-G Ratio 0.8 (*) 1.1 - 2.2     LIPASE    Collection Time    02/01/12  5:15 PM       Component Value Range    Lipase 396 (*) 73 - 393 U/L   ETHYL ALCOHOL    Collection Time    02/01/12  5:15 PM       Component Value Range    ALCOHOL(ETHYL),SERUM 135 (*) <10 MG/DL   URINALYSIS W/ REFLEX CULTURE    Collection Time    02/01/12  5:16 PM       Component Value Range    Color YELLOW      Appearance HAZY      Specific gravity 1.028  1.003 - 1.030      pH 6.0  5.0 - 8.0      Protein 30 (*) NEGATIVE MG/DL    Glucose NEGATIVE   NEGATIVE MG/DL    Ketone NEGATIVE   NEGATIVE MG/DL    Bilirubin NEGATIVE   NEGATIVE    Blood NEGATIVE   NEGATIVE    Urobilinogen 0.2  0.2 - 1.0 EU/DL    Nitrites NEGATIVE   NEGATIVE    Leukocyte Esterase SMALL (*) NEGATIVE    WBC 0-4  0 - 4 /HPF    RBC 0-3  0 - 5 /HPF    Epithelial cells 0-5  0 - 5 /LPF    Bacteria NEGATIVE   NEGATIVE /HPF    UA:UC IF INDICATED CULTURE NOT INDICATED BY UA RESULT        ________________________________________________________________________  Medications reviewed  Current Facility-Administered Medications   Medication Dose Route Frequency   ??? sodium chloride (NS) flush 5-10 mL  5-10 mL IntraVENous Q8H   ??? diatrizoate meglumine & sodium (MD-GASTROVIEW,GASTROGRAFIN) 66-10 % contrast solution 30 mL  30 mL Oral NOW   ??? sodium chloride 0.9 % bolus infusion 1,000 mL  1,000 mL IntraVENous CONTINUOUS   ??? ondansetron (ZOFRAN) injection 4 mg  4 mg IntraVENous NOW   ??? morphine injection 4 mg  4 mg IntraVENous NOW   ??? famotidine (PF) (PEPCID) injection 20 mg  20 mg IntraVENous NOW   ??? 0.9% sodium chloride 1,000 mL with mvi Adult with vit k 10 mL, thiamine 100 mg, folic acid 1 mg, magnesium sulfate 1 g infusion   IntraVENous NOW   ??? ioversol (OPTIRAY) 350 mg iodine/mL contrast solution 100 mL  100 mL IntraVENous RAD ONCE   ??? sodium chloride (NS) flush 10 mL  10 mL IntraVENous RAD ONCE   ??? 0.9% sodium chloride infusion  75 mL/hr IntraVENous ONCE   ??? sodium chloride (NS) flush 5-10 mL  5-10 mL IntraVENous Q8H   ???  sodium chloride (NS) flush 5-10 mL  5-10 mL IntraVENous PRN   ??? acetaminophen (TYLENOL) tablet 650 mg  650 mg Oral Q4H PRN   ??? HYDROcodone-acetaminophen (NORCO) 5-325 mg per tablet 1-2 Tab  1-2 Tab Oral Q4H PRN   ??? morphine injection 2 mg  2 mg IntraVENous Q4H PRN   ??? naloxone (NARCAN) injection 0.4 mg  0.4 mg IntraVENous PRN   ??? diphenhydrAMINE (BENADRYL) injection 12.5 mg  12.5 mg IntraVENous Q4H PRN   ??? ondansetron (ZOFRAN) injection 4 mg  4 mg IntraVENous Q4H PRN   ??? magnesium hydroxide (MILK OF MAGNESIA) oral suspension 30 mL  30 mL Oral DAILY PRN   ??? docusate sodium (COLACE) capsule 100 mg  100 mg Oral DAILY PRN   ??? nicotine (NICODERM CQ) 14 mg/24 hr patch 1 Patch  1 Patch TransDERmal DAILY PRN   ??? LORazepam (ATIVAN) injection 1 mg  1 mg IntraVENous Q6H PRN   ??? zolpidem (AMBIEN) tablet 5 mg  5 mg Oral QHS PRN   ??? enoxaparin (LOVENOX) injection 40 mg  40 mg SubCUTAneous DAILY   ???  pantoprazole (PROTONIX) injection 40 mg  40 mg IntraVENous Q24H       Ala Bent, DO  02/02/2012  1:32 AM

## 2012-02-02 NOTE — H&P (Signed)
Name:       Kevin Davenport, Kevin Davenport                 Admitted:    02/01/2012    Account #:  [id number]                     DOB:         05-23-1956  Physician:  Nanine Means, MD        Age:         56                               HISTORY AND PHYSICAL      CHIEF COMPLAINT: Abdominal pain.    HISTORY OF PRESENT ILLNESS: Dr. Halford Chessman admitted the patient last  night. I saw the patient on Feb 02, 2012, around 3 p.m. in his room 225,  bed 1, at [facility]. The patient is alert, oriented, and  his wife is available at bedside. I was able to reconstruct the following  information. The patient had been diagnosed with recurrent alcoholic  pancreatitis, and he had fallen back again and started drinking alcohol and  rum and other alcoholic drinks. Within a day or so, he developed  significant abdominal pain. He came to the emergency room where workup  revealed acute pancreatitis, possibly precipitated by alcoholism.  Therefore, he was admitted for further treatment and management. Since  then, the patient had been significantly improved and he is eager to go  home.    The patient's wife reported that he has had 3 episodes of pancreatitis and  this is his fourth time.    PAST MEDICAL HISTORY: Essentially alcoholic pancreatitis.    PERSONAL HISTORY: The patient acknowledges to smoking cigarettes about half  pack of cigarettes per day. He denies any current use of illicit drugs, but  he had done "drugs in the past." He has been illicit drug free for the last  7 years according to his wife. The patient drinks alcohol occasionally and  every time he drinks alcohol he gets pancreatitis. He does not have any  routine habit of drinking regular heavy alcohol.    FAMILY HISTORY: Hypertension in both parents.    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    REVIEW OF SYSTEMS  The patient denied any fever, fatigue, blurred vision, double vision,  allergy, sinusitis, cough, congestion,  chest pain, palpitation,  claudication, leg pain, leg cramps, dysuria, hematuria, anemia, lymph node  enlargement, acne, rash, numbness, weakness, dysarthria, anxiety, insomnia.      The patient did complain of abdominal pain radiating to the back and  band-like encroachment all around his abdomen, classic for acute  pancreatitis.    The patient also felt nauseous, had not had any vomiting. Denied any  hematemesis, melena, hematochezia.    PHYSICAL EXAMINATION  GENERAL: The patient is a well-built, well-nourished African American male  lying supine in bed, in no apparent distress.  VITAL SIGNS: Blood pressure 143/95, pulse 70, temperature 98.2,  respirations 12, BMI 30 kg/sq m, SpO2 96% in room air.  HEENT: Head is atraumatic, normocephalic. Pupils are equal. Extraocular  movements are intact. Oral mucosa moist and pink.  NECK: No JVD, carotid bruit, thyromegaly, lymphadenopathy noted.  CARDIOVASCULAR: S1, S2 normal. No S3, S4 appreciated. Upper and lower  extremity pulses are concordant and symmetrical.  RESPIRATORY SYSTEM: Trachea  midline. Chest excursion symmetrical. Breath  sounds vesicular. No sign of consolidation or effusion. Percussion note  bilaterally resonant in the dorsal scapular line.  ABDOMEN: Scaphoid, bowel sounds positive. No mass, tenderness,  organomegaly, hepatomegaly, splenomegaly, Faythe Casa or Cullen signs  noted. No suprapubic tenderness appreciated.  RECTAL AND GENITOURINARY: Examinations not done.  MUSCULOSKELETAL SYSTEM: Unremarkable for any sign of DVT, cyanosis,  jaundice.  NEUROLOGIC EVALUATION: Cranial nerves are intact. Sensory bilaterally  symmetrical. Motor movement bilaterally symmetrical.    LABORATORY DATA: Reviewed.    ASSESSMENT AND PLAN:  1. Recurrent alcoholic pancreatitis.  2. Tobacco abuse disorder.  3. Family history of hypertension.    Given the clinical scenario, at this point, the patient will remain  admitted to medical bed.    We will continue aggressive IV  hydration.    I have discontinued the scheduled Ativan which I do not find indicated in  this patient with no known history of dependence to benzodiazepines.    We will give him pain medications as needed with hydromorphone.    We will advance his diet tomorrow. We will check his CBC, BMP, and lipase  daily.    The patient has been given clear verbal instructions about the perils of  continuation of alcoholism, and I have asked him to refrain from further  alcohol abuse.    The patient has been also given verbal instructions to stop drinking and  stop using tobacco.    Overall, the patient's condition is reasonably stable. I anticipate the  patient can be discharged tomorrow if his condition improves.    Total care time, including face-to-face meeting with the patient and care  coordination, approximately 45 minutes.        Reviewed on 02/02/2012 7:47 PM                Nanine Means, MD    cc:                       Nanine Means, MD      HMY/wmx; D: 02/02/2012 04:18 P; T: 02/02/2012 04:48 P; DOC# [id number]; Job#  [id number]

## 2012-02-02 NOTE — H&P (Signed)
Pt Name  Kevin Davenport   Date of Birth 1955-09-19   Medical Record Number  [id number]   Age  56 y.o.   PCP None   Admitting Doctor Ala Bent, DO   Admit date:  02/01/2012   Room Number  225/01   Date of Service  02/02/2012   Dictation Number  [id number]  Chart reviewed   Pt seen and examined      Admission Diagnoses:      Acute pancreatitis     Isolation:                                                                                                           No Known Allergies       Diagnoses Present on Admission:   ???Alcohol abuse, continuous  ???Acute pancreatitis  ???Abdominal  pain, other specified site     Past Medical History   Diagnosis Date   ??? Other ill-defined conditions      Pancreatitis       History reviewed. No pertinent past surgical history.    History   Substance Use Topics   ??? Smoking status: Current Everyday Smoker -- 0.5 packs/day   ??? Smokeless tobacco: Not on file    Comment: 1/2 ppd x 18 years   ??? Alcohol Use: Yes      beer and hard liquior daily       Family History   Problem Relation Age of Onset   ??? Hypertension Mother    ??? Hypertension Father        Visit Vitals   Item Reading   ??? BP 143/95   ??? Pulse 70   ??? Temp 98.2 ??F (36.8 ??C)   ??? Resp 12   ??? Ht 5\' 8"  (1.727 m)   ??? Wt 200 lb 9.6 oz   ??? BMI 30.50 kg/m2   ??? SpO2 96%        Prior to Admission medications    Medication Sig Start Date End Date Taking? Authorizing Provider   gemfibrozil (LOPID) 600 mg tablet Take 1 Tab by mouth two (2) times a day (before meals). 12/16/09   Gabriel Carina, MD   omeprazole (PRILOSEC) 20 mg capsule Take 1 Cap by mouth daily. 12/16/09   Gabriel Carina, MD                     __________________________________________________________________________________  Recent Results (from the past 24 hour(s))   CBC WITH AUTOMATED DIFF    Collection Time    02/01/12  5:15 PM       Component Value Range    WBC 8.3  4.1 - 11.1 K/uL    RBC 4.72  4.10 - 5.70 M/uL    HGB 15.1  12.1 - 17.0 g/dL    HCT 91.4  78.2 - 95.6 %    MCV 88.1  80.0  - 99.0 FL    MCH 32.0  26.0 - 34.0 PG    MCHC 36.3  30.0 - 36.5 g/dL  RDW 14.1  11.5 - 14.5 %    PLATELET 222  150 - 400 K/uL    NEUTROPHILS 63  32 - 75 %    LYMPHOCYTES 28  12 - 49 %    MONOCYTES 7  5 - 13 %    EOSINOPHILS 1  0 - 7 %    BASOPHILS 1  0 - 1 %    ABS. NEUTROPHILS 5.3  1.8 - 8.0 K/UL    ABS. LYMPHOCYTES 2.3  0.8 - 3.5 K/UL    ABS. MONOCYTES 0.6  0.0 - 1.0 K/UL    ABS. EOSINOPHILS 0.1  0.0 - 0.4 K/UL    ABS. BASOPHILS 0.1  0.0 - 0.1 K/UL   METABOLIC PANEL, COMPREHENSIVE    Collection Time    02/01/12  5:15 PM       Component Value Range    Sodium 141  136 - 145 MMOL/L    Potassium 3.6  3.5 - 5.1 MMOL/L    Chloride 104  97 - 108 MMOL/L    CO2 26  21 - 32 MMOL/L    Anion gap 11  5 - 15 mmol/L    Glucose 98  65 - 100 MG/DL    BUN 12  6 - 20 MG/DL    Creatinine 1.61  0.96 - 1.15 MG/DL    BUN/Creatinine ratio 14  12 - 20      GFR est AA >60  >60 ml/min/1.28m2    GFR est non-AA >60  >60 ml/min/1.58m2    Calcium 8.4 (*) 8.5 - 10.1 MG/DL    Bilirubin, total 0.4  0.2 - 1.0 MG/DL    ALT 68  12 - 78 U/L    AST 48 (*) 15 - 37 U/L    Alk. phosphatase 109  50 - 136 U/L    Protein, total 7.9  6.4 - 8.2 g/dL    Albumin 3.4 (*) 3.5 - 5.0 g/dL    Globulin 4.5 (*) 2.0 - 4.0 g/dL    A-G Ratio 0.8 (*) 1.1 - 2.2     LIPASE    Collection Time    02/01/12  5:15 PM       Component Value Range    Lipase 396 (*) 73 - 393 U/L   ETHYL ALCOHOL    Collection Time    02/01/12  5:15 PM       Component Value Range    ALCOHOL(ETHYL),SERUM 135 (*) <10 MG/DL   URINALYSIS W/ REFLEX CULTURE    Collection Time    02/01/12  5:16 PM       Component Value Range    Color YELLOW      Appearance HAZY      Specific gravity 1.028  1.003 - 1.030      pH 6.0  5.0 - 8.0      Protein 30 (*) NEGATIVE MG/DL    Glucose NEGATIVE   NEGATIVE MG/DL    Ketone NEGATIVE   NEGATIVE MG/DL    Bilirubin NEGATIVE   NEGATIVE    Blood NEGATIVE   NEGATIVE    Urobilinogen 0.2  0.2 - 1.0 EU/DL    Nitrites NEGATIVE   NEGATIVE    Leukocyte Esterase SMALL (*) NEGATIVE    WBC 0-4   0 - 4 /HPF    RBC 0-3  0 - 5 /HPF    Epithelial cells 0-5  0 - 5 /LPF    Bacteria NEGATIVE   NEGATIVE /HPF    UA:UC  IF INDICATED CULTURE NOT INDICATED BY UA RESULT     METABOLIC PANEL, BASIC    Collection Time    02/02/12  4:00 AM       Component Value Range    Sodium 139  136 - 145 MMOL/L    Potassium 3.6  3.5 - 5.1 MMOL/L    Chloride 103  97 - 108 MMOL/L    CO2 25  21 - 32 MMOL/L    Anion gap 11  5 - 15 mmol/L    Glucose 73  65 - 100 MG/DL    BUN 8  6 - 20 MG/DL    Creatinine 5.95  6.38 - 1.15 MG/DL    BUN/Creatinine ratio 11 (*) 12 - 20      GFR est AA >60  >60 ml/min/1.29m2    GFR est non-AA >60  >60 ml/min/1.47m2    Calcium 8.0 (*) 8.5 - 10.1 MG/DL   CBC W/O DIFF    Collection Time    02/02/12  4:00 AM       Component Value Range    WBC 8.3  4.1 - 11.1 K/uL    RBC 4.54  4.10 - 5.70 M/uL    HGB 14.4  12.1 - 17.0 g/dL    HCT 75.6  43.3 - 29.5 %    MCV 88.3  80.0 - 99.0 FL    MCH 31.7  26.0 - 34.0 PG    MCHC 35.9  30.0 - 36.5 g/dL    RDW 18.8  41.6 - 60.6 %    PLATELET 209  150 - 400 K/uL     __________________________________________________________________________________  Current Facility-Administered Medications   Medication Dose Route Frequency   ??? cloNIDine (CATAPRES) tablet 0.1 mg  0.1 mg Oral Q2H PRN   ??? LORazepam (ATIVAN) injection 1 mg  1 mg IntraVENous Q2H PRN   ??? 0.9% sodium chloride 1,000 mL with mvi Adult with vit k 10 mL, thiamine 100 mg, folic acid 1 mg infusion   IntraVENous DAILY   ??? sodium chloride (NS) flush 5-10 mL  5-10 mL IntraVENous Q8H   ??? diatrizoate meglumine & sodium (MD-GASTROVIEW,GASTROGRAFIN) 66-10 % contrast solution 30 mL  30 mL Oral NOW   ??? sodium chloride 0.9 % bolus infusion 1,000 mL  1,000 mL IntraVENous CONTINUOUS   ??? ondansetron (ZOFRAN) injection 4 mg  4 mg IntraVENous NOW   ??? morphine injection 4 mg  4 mg IntraVENous NOW   ??? famotidine (PF) (PEPCID) injection 20 mg  20 mg IntraVENous NOW   ??? 0.9% sodium chloride 1,000 mL with mvi Adult with vit k 10 mL, thiamine 100 mg,  folic acid 1 mg, magnesium sulfate 1 g infusion   IntraVENous NOW   ??? ioversol (OPTIRAY) 350 mg iodine/mL contrast solution 100 mL  100 mL IntraVENous RAD ONCE   ??? sodium chloride (NS) flush 10 mL  10 mL IntraVENous RAD ONCE   ??? sodium chloride (NS) flush 5-10 mL  5-10 mL IntraVENous Q8H   ??? sodium chloride (NS) flush 5-10 mL  5-10 mL IntraVENous PRN   ??? acetaminophen (TYLENOL) tablet 650 mg  650 mg Oral Q4H PRN   ??? HYDROcodone-acetaminophen (NORCO) 5-325 mg per tablet 1-2 Tab  1-2 Tab Oral Q4H PRN   ??? morphine injection 2 mg  2 mg IntraVENous Q4H PRN   ??? naloxone (NARCAN) injection 0.4 mg  0.4 mg IntraVENous PRN   ??? diphenhydrAMINE (BENADRYL) injection 12.5 mg  12.5 mg IntraVENous Q4H PRN   ???  ondansetron (ZOFRAN) injection 4 mg  4 mg IntraVENous Q4H PRN   ??? magnesium hydroxide (MILK OF MAGNESIA) oral suspension 30 mL  30 mL Oral DAILY PRN   ??? docusate sodium (COLACE) capsule 100 mg  100 mg Oral DAILY PRN   ??? nicotine (NICODERM CQ) 14 mg/24 hr patch 1 Patch  1 Patch TransDERmal DAILY PRN   ??? LORazepam (ATIVAN) injection 1 mg  1 mg IntraVENous Q6H PRN   ??? zolpidem (AMBIEN) tablet 5 mg  5 mg Oral QHS PRN   ??? enoxaparin (LOVENOX) injection 40 mg  40 mg SubCUTAneous DAILY   ??? pantoprazole (PROTONIX) injection 40 mg  40 mg IntraVENous Q24H     __________________________________________________________________________________  __________________________________________________________________________________    Sherian Rein MD MPH   02/02/2012  4:09 PM

## 2012-02-02 NOTE — Progress Notes (Signed)
Bedside and Verbal shift change report given to Jenkins RN (oncoming nurse) by Brandon RN (offgoing nurse).  Report given with SBAR.

## 2012-02-02 NOTE — Progress Notes (Signed)
Spiritual Care Assessment/Progress Notes    Kevin Davenport [id number]  [id number]    [birth year]  56 y.o.  male    Patient Telephone Number: [phone] (home)   Religious Affiliation: No religion   Language: English   Extended Emergency Contact Information  Primary Emergency Contact: Kevin Davenport [place]  Home Phone: [phone]  Relation: Parent   Patient Active Problem List   Diagnoses Date Noted   ??? Abdominal  pain, other specified site 02/02/2012   ??? Alcohol abuse, continuous 12/14/2009   ??? Acute pancreatitis 12/14/2009        Date: 02/02/2012       Level of Religious/Spiritual Activity:  []          Involved in faith tradition/spiritual practice    []          Not involved in faith tradition/spiritual practice  [x]          Spiritually oriented    []          Claims no spiritual orientation    []          seeking spiritual identity  []          Feels alienated from religious practice/tradition  []          Feels angry about religious practice/tradition  [x]          Spirituality/religious tradition seems to be a Theatre stage manager for coping at this time.  []          Not able to assess due to medical condition    Services Provided Today:  []          crisis intervention    []          reading Scriptures  [x]          spiritual assessment    [x]          prayer  [x]          empathic listening/emotional support  []          rites and rituals (cite in comments)  []          life review     []          religious support  []          theological development    []          advocacy  []          ethical dialog     []          blessing  []          bereavement support    []          support to family  []          anticipatory grief support   []          help with AMD  []          spiritual guidance    []          meditation      Spiritual Care Needs  []          Emotional Support  []          Spiritual/Religious Care  []          Loss/Adjustment  []          Advocacy/Referral /Ethics  [x]          No needs expressed at this time  []           Other: (note in comments)  Spiritual Care Plan  []   Follow up visits with pt/family  []          Provide materials  []          Schedule sacraments  []          Contact Community Clergy  [x]          Follow up as needed  []          Other: (note in comments)     Comments:  Initial visit with patient on Med/Surg for spiritual assessment.  Patient's sister and one other woman who did not identify herself were present.  Kevin Davenport shared that he is doing well.  He said he is always aware of God's presence and has good spiritual support.  Provided pastoral support, listening presence and assurance of prayer.  Advised patient of chaplain availability.  Kevin Davenport, Staff Chaplain    Chaplain Paging Service  Kevin Davenport [phone])

## 2012-02-02 NOTE — Progress Notes (Signed)
Problem: Alcohol Withdrawal  Goal: *STG: Participates in treatment plan  Outcome: Progressing Towards Goal  Pt knows to call for assistance.

## 2012-02-02 NOTE — Progress Notes (Signed)
Banana bag hung at 75 not 125 only one bag hung documentation incorrect on dced bag correct dose given at 75 as per md order

## 2012-02-03 LAB — CBC WITH AUTOMATED DIFF
ABS. BASOPHILS: 0 10*3/uL (ref 0.0–0.1)
ABS. EOSINOPHILS: 0.1 10*3/uL (ref 0.0–0.4)
ABS. LYMPHOCYTES: 1.4 10*3/uL (ref 0.8–3.5)
ABS. MONOCYTES: 0.6 10*3/uL (ref 0.0–1.0)
ABS. NEUTROPHILS: 3.3 10*3/uL (ref 1.8–8.0)
BASOPHILS: 0 % (ref 0–1)
EOSINOPHILS: 1 % (ref 0–7)
HCT: 38.7 % (ref 36.6–50.3)
HGB: 14 g/dL (ref 12.1–17.0)
LYMPHOCYTES: 26 % (ref 12–49)
MCH: 32.3 PG (ref 26.0–34.0)
MCHC: 36.2 g/dL (ref 30.0–36.5)
MCV: 89.4 FL (ref 80.0–99.0)
MONOCYTES: 11 % (ref 5–13)
NEUTROPHILS: 62 % (ref 32–75)
PLATELET: 189 10*3/uL (ref 150–400)
RBC: 4.33 M/uL (ref 4.10–5.70)
RDW: 14 % (ref 11.5–14.5)
WBC: 5.3 10*3/uL (ref 4.1–11.1)

## 2012-02-03 LAB — LIPID PANEL
CHOL/HDL Ratio: 3.4 (ref 0–5.0)
Cholesterol, total: 228 MG/DL — ABNORMAL HIGH (ref <200)
HDL Cholesterol: 67 MG/DL
LDL, calculated: 144.8 MG/DL — ABNORMAL HIGH (ref 0–100)
Triglyceride: 81 MG/DL (ref <150)
VLDL, calculated: 16.2 MG/DL

## 2012-02-03 LAB — T4, FREE: T4, Free: 0.7 NG/DL — ABNORMAL LOW (ref 0.8–1.5)

## 2012-02-03 LAB — TSH 3RD GENERATION: TSH: 3.88 u[IU]/mL — ABNORMAL HIGH (ref 0.36–3.74)

## 2012-02-03 LAB — HEMOGLOBIN A1C WITH EAG
Est. average glucose: 117 mg/dL
Hemoglobin A1c: 5.7 % (ref 4.2–6.3)

## 2012-02-03 LAB — LIPASE: Lipase: 178 U/L (ref 73–393)

## 2012-02-03 LAB — CRP, HIGH SENSITIVITY: CRP, High sensitivity: >9.5 mg/L

## 2012-02-03 LAB — SED RATE (ESR): Sed rate (ESR): 7 MM/HR (ref 0–20)

## 2012-02-03 MED ORDER — OXYCODONE-ACETAMINOPHEN 5 MG-325 MG TAB
5-325 mg | ORAL_TABLET | Freq: Four times a day (QID) | ORAL | Status: DC | PRN
Start: 2012-02-03 — End: 2012-10-09

## 2012-02-03 MED ORDER — OMEPRAZOLE 20 MG CAP, DELAYED RELEASE
20 mg | ORAL_CAPSULE | Freq: Every day | ORAL | Status: DC
Start: 2012-02-03 — End: 2012-10-09

## 2012-02-03 MED FILL — SALINE FLUSH INJECTION SYRINGE: INTRAMUSCULAR | Qty: 10

## 2012-02-03 MED FILL — CLONIDINE 0.1 MG TAB: 0.1 mg | ORAL | Qty: 1

## 2012-02-03 MED FILL — SODIUM CHLORIDE 0.9 % INJECTION: INTRAMUSCULAR | Qty: 10

## 2012-02-03 MED FILL — LOVENOX 40 MG/0.4 ML SUBCUTANEOUS SYRINGE: 40 mg/0.4 mL | SUBCUTANEOUS | Qty: 0.4

## 2012-02-03 MED FILL — SODIUM CHLORIDE 0.9 % IV: INTRAVENOUS | Qty: 1000

## 2012-02-03 NOTE — Progress Notes (Addendum)
Location: - [address]  Attn.: Nanine Means  DOB: May 04, 1956 / Age: 56  MR#: [id number] / Admit#: [id number]  Pt. First Name: Kevin  Pt. Last Name: [name] [facility]  [address] [place]  [address]                        Case Management - Progress Note  Initial Open Date: 02/02/2012  Case Manager: Velvet Mangum-Holmes    Initial Open Date:  Social Worker:  Expected Date of Discharge:  Transferred From: home  ECF Bed Held Until:  Bed Held By:  Power of Attorney:  POA/Guardian/Conservator Capacity:  Primary Caregiver:  Living Arrangements: Own Home  Source of Income:  Payee:  Psychosocial History:  Cultural/Religious/Language Issues:  Education Level:  ADLS/Current Living Arrangements Issues: spouse, dtr,  works  , no DME.  self-care PTA  Past Providers:  Will patient perform self care at discharge? Y  Anticipated Discharge Disposition Goal: Return to admission address  Assessment/Plan:   02/03/2012 06:11P  CM arranged follow up appt with pt Dr. Driscilla Moats for 02/22/12 at 3:00pm as  following PCP.  Luz Lex, [place]  [phone]         02/02/2012 01:12P  Case open for coordination of services.  No listed PCP. Reveiwed chart this  am. Work-up in progress. Pt from home with family.  Will follow-up with resources for Substance abuse due to hx of drinking.  assessing in progress. Detail note to follow.  Myles Gip MSW RN  Luz Lex BSW CM  [phone]  Resources at Discharge:  Service Providers at Discharge:  Dictating Provider:  Robyne Peers

## 2012-02-03 NOTE — Progress Notes (Signed)
Patient is being discharged with an updated home medication list from physician. Reviewed discharge instructions and prescriptions with patient verbalized understanding.Personal belongings take with patient. Ambulated off the unit in stable condition.

## 2012-02-03 NOTE — Progress Notes (Signed)
Attended Medical Surgery and Telemetry Interdisciplinary Rounds at [facility]  Chaplain Martha J. Pittenger [facility]  Chaplain Paging Service [phone]

## 2012-02-03 NOTE — Discharge Summary (Signed)
Physician Discharge Summary     Pt Name  Kevin Davenport   Admit date:  02/01/2012;02/03/2012   Discharge date and time:  02/03/2012;2:21 PM   Medical Record Number  [id number]   Age  56 y.o.   Date of Birth Feb 22, 1956   PCP None     Admission Diagnoses:            Acute pancreatitis   Hospital Problems  Date Reviewed: January 07, 2010      Codes Class Noted POA    Abdominal  pain, other specified site 789.09  02/02/2012 Yes        Alcohol abuse, continuous (Chronic) 305.01  07-Jan-2010 Yes        *Acute pancreatitis 577.0  2010/01/07 Yes                    No Known Allergies     Hospital Course      This pt was admitted with  Acute pancreatitis on 02/01/2012.  Pt was given bowel rest and IVF and he was treated with narcotics for pain control. We advanced his diet and at this time he has tolerated cardiac diet. We noted elevated SBP since this morning. We have discontinued his IVF and we plan to discharge him to home with advice for checking his BP at home.     To complete his treatment we will start him on low dose beta blocker and limited number of percocet at this time.       Condition at the time of discharge improved and stable .     Treatment team::  Treatment Team: Attending Provider: Gabriel Carina, MD; Consulting Provider: Gabriel Carina, MD       Other Pertinent data:   TODAY's CLINICAL FINDINGS:   Visit Vitals   Item Reading   ??? BP 164/97   ??? Pulse 67   ??? Temp 97.3 ??F (36.3 ??C)   ??? Resp 16   ??? Ht 5\' 8"  (1.727 m)   ??? Wt 200 lb 9.6 oz   ??? BMI 30.50 kg/m2   ??? SpO2 98%      Wt Readings from Last 10 Encounters:   02/01/12 200 lb 9.6 oz   01/03/12 198 lb 6.4 oz   05/11/10 211 lb   January 07, 2010 210 lb   01/23/09 210 lb          Exam: Pt is alert and oriented ; in no apparent distress       Current Discharge Medication List      START taking these medications    Details   oxyCODONE-acetaminophen (PERCOCET) 5-325 mg per tablet Take 1 Tab by mouth every six (6) hours as needed for Pain.  Qty: 30 Tab, Refills: 0         CONTINUE these  medications which have CHANGED    Details   omeprazole (PRILOSEC) 20 mg capsule Take 1 Cap by mouth daily.  Qty: 30 Cap, Refills: 1         CONTINUE these medications which have NOT CHANGED    Details   gemfibrozil (LOPID) 600 mg tablet Take 1 Tab by mouth two (2) times a day (before meals).  Qty: 60 Tab, Refills: 5             Disposition:Home.   Diet: Cardiac Diet   Follow up Care:    1. Dr. Waymon Amato  in 1-2 weeks   Significant Diagnostic Studies:   Recent Labs   [facility]  02/03/12 0400 02/02/12 0400    WBC 5.3 8.3    HGB 14.0 14.4    HCT 38.7 40.1    PLT 189 209     Recent Labs   Basename 02/02/12 0400 02/01/12 1715    NA 139 141    K 3.6 3.6    CL 103 104    CO2 25 26    BUN 8 12    CREA 0.72 0.86    GLU 73 98    CA 8.0* 8.4*    MG -- --    PHOS -- --    URICA -- --     Recent Labs   Basename 02/03/12 0400 02/01/12 1715    SGOT -- 48*    GPT -- 68    AP -- 109    TBIL -- 0.4    TP -- 7.9    ALB -- 3.4*    GLOB -- 4.5*    GGT -- --    AML -- --    LPSE 178 396*     No results found for this basename: INR:3,PTP:3,APTT:3, in the last 72 hours   No results found for this basename: FE:2,TIBC:2,PSAT:2,FERR:2, in the last 72 hours   No results found for this basename: PH:2,PCO2:2,PO2:2, in the last 72 hours  No results found for this basename: CPK:3,CKMB:3,TROPONINI:3, in the last 72 hours  No results found for this basename: GLPOC                 ________________________________________________________________________    Doylene Canard Time for face to face meeting with the pt and examination including care coordination with staff and chart review (>50% of total time listed here )  approx. 35 min]    _________________________________________________________________________  Gabriel Carina, MD MPH  02/03/2012

## 2012-02-04 NOTE — Progress Notes (Signed)
Follow up call.    No issues. Pt aware of follow up appt.    Gracelyn Nurse, MSW, [phone]

## 2012-02-22 MED ORDER — ATENOLOL 25 MG TAB
25 mg | ORAL_TABLET | Freq: Every day | ORAL | Status: DC
Start: 2012-02-22 — End: 2012-10-09

## 2012-02-22 MED ORDER — LORAZEPAM 0.5 MG TAB
0.5 mg | ORAL_TABLET | Freq: Three times a day (TID) | ORAL | Status: DC | PRN
Start: 2012-02-22 — End: 2012-10-09

## 2012-02-22 NOTE — Patient Instructions (Signed)
MyChart Activation    Thank you for requesting access to MyChart. Please follow the instructions below to securely access and download your online medical record. MyChart allows you to send messages to your doctor, view your test results, renew your prescriptions, schedule appointments, and more.    How Do I Sign Up?    1. In your internet browser, go to [email or web address].  2. Click on the First Time User? Click Here link in the Sign In box. You will see the New Member Sign Up page.  3. Enter your MyChart Access Code exactly as it appears below. You will not need to use this code after you???ve completed the sign-up process. If you do not sign up before the expiration date, you must request a new code.    MyChart Access Code: [email or web address]  Expires: 05/03/2012  2:58 PM (This is the date your MyChart access code will expire)    4. Enter the last four digits of your Social Security Number (xxxx) and Date of Birth (mm/dd/yyyy) as indicated and click Submit. You will be taken to the next sign-up page.  5. Create a MyChart ID. This will be your MyChart login ID and cannot be changed, so think of one that is secure and easy to remember.  6. Create a MyChart password. You can change your password at any time.  7. Enter your Password Reset Question and Answer. This can be used at a later time if you forget your password.   8. Enter your e-mail address. You will receive e-mail notification when new information is available in MyChart.  9. Click Sign Up. You can now view and download portions of your medical record.  10. Click the Download Summary menu link to download a portable copy of your medical information.    Additional Information    If you have questions, please visit the Frequently Asked Questions section of the MyChart website at [email or web address] Remember, MyChart is NOT to be used for urgent needs. For medical emergencies, dial 911.

## 2012-02-22 NOTE — Progress Notes (Signed)
Subjective:    Kevin Davenport is a 56 y.o. male who new to care.  Patient has a presenting complaint of:  Pancreatitis  Patient also has the following past medical history: alcohol abuse disorder.      Hypertension:  The patient Kevin Davenport has hypertension.  Medication compliance: compliant most of the time,   Diabetic diet compliance: noncompliant some of the time  Home blood pressure monitoring: are not performed    Hypertensive/CVS ROS:  taking medications as instructed, no medication side effects noted, no TIA's, no chest pain on exertion, no dyspnea on exertion, no swelling of ankles    Labs & tests reviewed:   none    Discussed with patient the following preventative measures:  increased exercise  Anxiety  The patient Kevin Davenport is seen for anxiety disorder. Onset was several years ago, with symptom progression gradually worsening since that time.  Pt admits to drinking to self medicate . He has not been drinking x since hospital discharge    Ongoing symptoms include: paresthesias, insomnia, racing thoughts.   Patient denies: suicidal ideation, homocidal ideation.   Reported side effects from the treatment: not on treatment.         Review of Systems:   Pertinent Positives: Admits to trouble thinking and anxiety   Pertinent Negatives: Denies no suicidal ideation     Ten point system review performed  Patient Active Problem List   Diagnoses Date Noted   ??? Anxiety 02/22/2012   ??? Abdominal  pain, other specified site 02/02/2012   ??? Alcohol abuse, continuous 12/14/2009   ??? Acute pancreatitis 12/14/2009        Past Medical History   Diagnosis Date   ??? Other ill-defined conditions      Pancreatitis       No Known Allergies    Current Outpatient Prescriptions   Medication Sig Dispense Refill   ??? omeprazole (PRILOSEC) 20 mg capsule Take 1 Cap by mouth daily.  30 Cap  1   ??? oxyCODONE-acetaminophen (PERCOCET) 5-325 mg per tablet Take 1 Tab by mouth every six (6) hours as needed for Pain.  30 Tab  0   ???  gemfibrozil (LOPID) 600 mg tablet Take 1 Tab by mouth two (2) times a day (before meals).  60 Tab  5       Family History   Problem Relation Age of Onset   ??? Hypertension Mother    ??? Hypertension Father        History     Social History   ??? Marital Status: UNKNOWN     Spouse Name: N/A     Number of Children: N/A   ??? Years of Education: N/A     Occupational History   ??? Not on file.     Social History Main Topics   ??? Smoking status: Current Everyday Smoker -- 0.5 packs/day   ??? Smokeless tobacco: Not on file    Comment: 1/2 ppd x 18 years   ??? Alcohol Use: No      beer and hard liquior daily quit on the 21st of may   ??? Drug Use: No   ??? Sexually Active: Not on file     Other Topics Concern   ??? Not on file     Social History Narrative   ??? No narrative on file       Objective:    Blood pressure 110/78, pulse 68, temperature 98.4 ??F (36.9 ??C), temperature source Oral, resp. rate 12,  height 5\' 8"  (1.727 m), weight 195 lb 6 oz (88.622 kg), SpO2 99.00%.  Physical Exam:   BP 110/78   Pulse 68   Temp(Src) 98.4 ??F (36.9 ??C) (Oral)   Resp 12   Ht 5\' 8"  (1.727 m)   Wt 195 lb 6 oz (88.622 kg)   BMI 29.71 kg/m2   SpO2 99%    General appearance  alert, cooperative, no distress, appears stated age   Head  Normocephalic, without obvious abnormality, atraumatic   Eyes  conjunctivae/corneas clear. PERRL, EOM's intact. Fundi benign   Ears  normal TM's and external ear canals AU   Nose Nares normal. Septum midline. Mucosa normal. No drainage or sinus tenderness.   Throat Lips, mucosa, and tongue normal. Teeth and gums normal   Neck supple, symmetrical, trachea midline, no adenopathy, thyroid: not enlarged, symmetric, no tenderness/mass/nodules, no carotid bruit and no JVD   Back   symmetric, no curvature. ROM normal. No CVA tenderness   Lungs   clear to auscultation bilaterally   Chest wall  no tenderness   Heart  regular rate and rhythm, S1, S2 normal, no murmur, click, rub or gallop   Abdomen   soft, non-tender. Bowel sounds normal. No  masses,  No organomegaly           Extremities extremities normal, atraumatic, no cyanosis or edema   Pulses 2+ and symmetric   Skin Skin color, texture, turgor normal. No rashes or lesions   Lymph nodes Cervical, supraclavicular, and axillary nodes normal.   Neurologic Normal          1. Alcohol abuse, continuous  LORazepam (ATIVAN) 0.5 mg tablet   2. HTN (hypertension)  atenolol (TENORMIN) 25 mg tablet   3. Anxiety  LORazepam (ATIVAN) 0.5 mg tablet   4. Acute pancreatitis         I have discussed the diagnosis with the patient and the intended plan as seen in the above orders.     The patient has received an after-visit summary and questions were answered concerning future plans.     I have discussed medication side effects and warnings with the patient as well.    Reviewed test results at length with patient        Reviewed test results,    Take multi-vitamin daily, take calcium 1200 mg, Vitamin D 800 mcg

## 2012-10-09 LAB — METABOLIC PANEL, COMPREHENSIVE
A-G Ratio: 0.8 — ABNORMAL LOW (ref 1.1–2.2)
ALT (SGPT): 49 U/L (ref 12–78)
AST (SGOT): 69 U/L — ABNORMAL HIGH (ref 15–37)
Albumin: 3.4 g/dL — ABNORMAL LOW (ref 3.5–5.0)
Alk. phosphatase: 122 U/L (ref 50–136)
Anion gap: 9 mmol/L (ref 5–15)
BUN/Creatinine ratio: 17 (ref 12–20)
BUN: 14 MG/DL (ref 6–20)
Bilirubin, total: 0.8 MG/DL (ref 0.2–1.0)
CO2: 29 MMOL/L (ref 21–32)
Calcium: 8.4 MG/DL — ABNORMAL LOW (ref 8.5–10.1)
Chloride: 103 MMOL/L (ref 97–108)
Creatinine: 0.81 MG/DL (ref 0.45–1.15)
GFR est AA: >60 mL/min/{1.73_m2} (ref >60)
GFR est non-AA: >60 mL/min/{1.73_m2} (ref >60)
Globulin: 4.5 g/dL — ABNORMAL HIGH (ref 2.0–4.0)
Glucose: 94 MG/DL (ref 65–100)
Potassium: 4.2 MMOL/L (ref 3.5–5.1)
Protein, total: 7.9 g/dL (ref 6.4–8.2)
Sodium: 141 MMOL/L (ref 136–145)

## 2012-10-09 LAB — CBC WITH AUTOMATED DIFF
ABS. BASOPHILS: 0 10*3/uL (ref 0.0–0.1)
ABS. EOSINOPHILS: 0 10*3/uL (ref 0.0–0.4)
ABS. LYMPHOCYTES: 1.9 10*3/uL (ref 0.8–3.5)
ABS. MONOCYTES: 0.5 10*3/uL (ref 0.0–1.0)
ABS. NEUTROPHILS: 4.8 10*3/uL (ref 1.8–8.0)
BASOPHILS: 1 % (ref 0–1)
EOSINOPHILS: 0 % (ref 0–7)
HCT: 43.8 % (ref 36.6–50.3)
HGB: 15.6 g/dL (ref 12.1–17.0)
LYMPHOCYTES: 26 % (ref 12–49)
MCH: 31.8 PG (ref 26.0–34.0)
MCHC: 35.6 g/dL (ref 30.0–36.5)
MCV: 89.4 FL (ref 80.0–99.0)
MONOCYTES: 7 % (ref 5–13)
NEUTROPHILS: 66 % (ref 32–75)
PLATELET: 209 10*3/uL (ref 150–400)
RBC: 4.9 M/uL (ref 4.10–5.70)
RDW: 16.3 % — ABNORMAL HIGH (ref 11.5–14.5)
WBC: 7.2 10*3/uL (ref 4.1–11.1)

## 2012-10-09 LAB — URINALYSIS W/ REFLEX CULTURE
Bacteria: NEGATIVE /HPF
Bilirubin: NEGATIVE
Blood: NEGATIVE
Glucose: NEGATIVE MG/DL
Ketone: NEGATIVE MG/DL
Leukocyte Esterase: NEGATIVE
Nitrites: NEGATIVE
Protein: 100 MG/DL — AB
Specific gravity: 1.019 (ref 1.003–1.030)
Urobilinogen: 1 EU/DL (ref 0.2–1.0)
pH (UA): 5 (ref 5.0–8.0)

## 2012-10-09 LAB — LIPASE: Lipase: 125 U/L (ref 73–393)

## 2012-10-09 LAB — CK W/ CKMB & INDEX
CK - MB: 0.9 NG/ML (ref 0.5–3.6)
CK-MB Index: 0.6 (ref 0–2.5)
CK: 162 U/L (ref 39–308)

## 2012-10-09 LAB — TROPONIN I: Troponin-I, Qt.: <0.04 ng/mL (ref <0.05)

## 2012-10-09 MED ORDER — SODIUM CHLORIDE 0.9 % IJ SYRG
Freq: Three times a day (TID) | INTRAMUSCULAR | Status: DC
Start: 2012-10-09 — End: 2012-10-09

## 2012-10-09 MED ORDER — CLONIDINE 0.1 MG TAB
0.1 mg | ORAL | Status: AC
Start: 2012-10-09 — End: 2012-10-09
  Administered 2012-10-09: 22:00:00 via ORAL

## 2012-10-09 MED ORDER — HYDROCODONE-ACETAMINOPHEN 5 MG-325 MG TAB
5-325 mg | ORAL_TABLET | Freq: Four times a day (QID) | ORAL | Status: DC | PRN
Start: 2012-10-09 — End: 2013-04-12

## 2012-10-09 MED ORDER — SODIUM CHLORIDE 0.9 % IJ SYRG
INTRAMUSCULAR | Status: DC | PRN
Start: 2012-10-09 — End: 2012-10-09

## 2012-10-09 MED FILL — CLONIDINE 0.1 MG TAB: 0.1 mg | ORAL | Qty: 1

## 2012-10-09 NOTE — ED Provider Notes (Signed)
HPI Comments: 56 yo m smoker h/o heavy drinking, hospitalizations for pancreatitis and alcoholic gastritis p/w 1 week worsening abdominal pain. Pain is currently 7/10, is achy and primarily located in lower abdomen, worsens at night. Pt reports subjective chills and fevers. He denies N/V/D, decreased appetite, cough - though pt coughed during exam. Pt states he usually drinks 5-6 beers/day but had quit until his sister's birthday on Saturday when he drank "quite a lot".     Patient is a 57 y.o. male presenting with abdominal pain.   Abdominal Pain   Associated symptoms include a fever and headaches. Pertinent negatives include no diarrhea, no nausea, no vomiting, no dysuria, no hematuria, no arthralgias, no myalgias, no chest pain and no back pain.        Past Medical History   Diagnosis Date   ??? Other ill-defined conditions      Pancreatitis        No past surgical history on file.      Family History   Problem Relation Age of Onset   ??? Hypertension Mother    ??? Hypertension Father         History     Social History   ??? Marital Status: MARRIED     Spouse Name: N/A     Number of Children: N/A   ??? Years of Education: N/A     Occupational History   ??? Not on file.     Social History Main Topics   ??? Smoking status: Current Every Day Smoker -- 0.50 packs/day   ??? Smokeless tobacco: Not on file      Comment: 1/2 ppd x 18 years   ??? Alcohol Use: No      Comment: beer and hard liquior daily quit on the 21st of may   ??? Drug Use: No   ??? Sexually Active: Not on file     Other Topics Concern   ??? Not on file     Social History Narrative   ??? No narrative on file                  ALLERGIES: Review of patient's allergies indicates no known allergies.      Review of Systems   Constitutional: Positive for fever and chills. Negative for activity change and appetite change.   HENT: Positive for sore throat. Negative for congestion, rhinorrhea, neck pain, neck stiffness and sinus pressure.    Respiratory: Positive for cough. Negative for  chest tightness, shortness of breath and wheezing.    Cardiovascular: Negative for chest pain, palpitations and leg swelling.   Gastrointestinal: Positive for abdominal pain. Negative for nausea, vomiting, diarrhea and abdominal distention.   Genitourinary: Negative for dysuria, urgency, hematuria and flank pain.   Musculoskeletal: Negative for myalgias, back pain, joint swelling and arthralgias.   Skin: Negative for pallor, rash and wound.   Neurological: Positive for headaches. Negative for dizziness, weakness, light-headedness and numbness.   Psychiatric/Behavioral: Negative for behavioral problems and agitation.   All other systems reviewed and are negative.        Filed Vitals:    10/09/12 1243   BP: 166/113   Pulse: 98   Temp: 98.5 ??F (36.9 ??C)   Resp: 18   Height: 5\' 8"  (1.727 m)   Weight: 95.255 kg (210 lb)   SpO2: 97%            Physical Exam   Nursing note and vitals reviewed.  Constitutional: He is oriented to  person, place, and time. He appears well-developed and well-nourished. No distress.   HENT:   Head: Normocephalic and atraumatic.   Right Ear: External ear normal.   Left Ear: External ear normal.   Nose: Nose normal.   Mouth/Throat: Oropharynx is clear and moist. No oropharyngeal exudate.   Eyes: Conjunctivae and EOM are normal. Pupils are equal, round, and reactive to light. Right eye exhibits no discharge. Left eye exhibits no discharge. No scleral icterus.   Neck: Normal range of motion. Neck supple.   Cardiovascular: Normal rate, regular rhythm, normal heart sounds and intact distal pulses.  Exam reveals no gallop and no friction rub.    No murmur heard.  Pulmonary/Chest: Effort normal and breath sounds normal. No respiratory distress. He has no wheezes. He has no rales. He exhibits no tenderness.   Abdominal: Bowel sounds are normal. There is tenderness. There is guarding. There is no rebound.   Musculoskeletal: Normal range of motion. He exhibits no edema and no tenderness.   Lymphadenopathy:      He has no cervical adenopathy.   Neurological: He is alert and oriented to person, place, and time. No cranial nerve deficit.   Skin: Skin is warm and dry. No rash noted. He is not diaphoretic. No erythema.   Psychiatric: He has a normal mood and affect. His behavior is normal. Judgment and thought content normal.        MDM     Differential Diagnosis; Clinical Impression; Plan:     DDX: pancreatitis v. Alcoholic gastritis v. Viral gastritis v. PUD   Amount and/or Complexity of Data Reviewed:   Clinical lab tests:  Ordered and reviewed  Tests in the radiology section of CPT??:  Ordered and reviewed   Discuss the patient with another provider:  Yes      Procedures    4:11 PM  Abdominal exam improved: BS nl, belly soft, no rebound, no guarding.   Pt reports improved abd pain.   Nurse will take vitals to reassess BP.    4:30 PM Vitals reviewed, BP still elevated. EKG ordered, reviewed by Dr. Dorothey Baseman and showed minimal ST elevation, unchanged when compared to EKG done. Discussed with Dr. Dorothey Baseman. Troponin and CK ordered.     5:24 PM Cardiac labs reviewed as normal.   Discussed with the patient and all questioned fully answered. He will return to ER if chest pain, dizziness, fast heart beat, shakiness appears or abdominal pain worsens.

## 2012-10-09 NOTE — ED Notes (Signed)
Went to pts room to reassess.  Pt was not in room and all of his belongings were gone too.  Pt was aware that we were waiting for cardiac enzyme results.  [place], PA states she saw pt pacing the room.  Pt eloped hospital.

## 2012-10-09 NOTE — ED Provider Notes (Signed)
HPI Comments: Pt has h/o heavy alcohol use, pancreatitis and alcoholic gastritis. Pt states he had stopped drinking, but had "quite a lot" of beer on 2 days ago at his sister's bday party. Pt denies feeling tremulous.    Patient is a 57 y.o. male presenting with abdominal pain.   Abdominal Pain   Associated symptoms include a fever and headaches. Pertinent negatives include no diarrhea, no nausea, no vomiting, no dysuria, no hematuria, no arthralgias, no myalgias, no chest pain and no back pain.        Past Medical History   Diagnosis Date   ??? Other ill-defined conditions      Pancreatitis        History reviewed. No pertinent past surgical history.      Family History   Problem Relation Age of Onset   ??? Hypertension Mother    ??? Hypertension Father         History     Social History   ??? Marital Status: MARRIED     Spouse Name: N/A     Number of Children: N/A   ??? Years of Education: N/A     Occupational History   ??? Not on file.     Social History Main Topics   ??? Smoking status: Current Every Day Smoker -- 0.50 packs/day   ??? Smokeless tobacco: Not on file      Comment: 1/2 ppd x 18 years   ??? Alcohol Use: Yes      Comment: Drinks an huge amount everyday unsure of ounces.   ??? Drug Use: No   ??? Sexually Active: Not on file     Other Topics Concern   ??? Not on file     Social History Narrative   ??? No narrative on file                  ALLERGIES: Review of patient's allergies indicates no known allergies.      Review of Systems   Unable to perform ROS  Constitutional: Positive for fever and chills. Negative for activity change and appetite change.   HENT: Negative for congestion, sore throat, rhinorrhea, neck pain and neck stiffness.    Eyes: Negative for pain, redness and itching.   Respiratory: Negative for cough, chest tightness and wheezing.    Cardiovascular: Negative for chest pain, palpitations and leg swelling.   Gastrointestinal: Positive for abdominal pain. Negative for nausea, vomiting and diarrhea.    Genitourinary: Negative for dysuria, urgency and hematuria.   Musculoskeletal: Negative for myalgias, back pain and arthralgias.   Skin: Negative for pallor, rash and wound.   Neurological: Positive for headaches. Negative for dizziness, weakness, light-headedness and numbness.   Psychiatric/Behavioral: Negative for behavioral problems and agitation.   All other systems reviewed and are negative.        Filed Vitals:    10/09/12 1243 10/09/12 1616 10/09/12 1630   BP: 166/113 164/119 167/116   Pulse: 98 90    Temp: 98.5 ??F (36.9 ??C)     Resp: 18 18    Height: 5\' 8"  (1.727 m)     Weight: 95.255 kg (210 lb)     SpO2: 97% 98%             Physical Exam   Nursing note and vitals reviewed.  Constitutional: He is oriented to person, place, and time. He appears well-developed and well-nourished. No distress.   HENT:   Head: Normocephalic and atraumatic.   Right Ear:  External ear normal.   Left Ear: External ear normal.   Mouth/Throat: Oropharynx is clear and moist. No oropharyngeal exudate.   Eyes: Conjunctivae and EOM are normal. Pupils are equal, round, and reactive to light. Right eye exhibits no discharge. Left eye exhibits no discharge.   Neck: Normal range of motion. Neck supple.   Cardiovascular: Normal rate, regular rhythm, normal heart sounds and intact distal pulses.  Exam reveals no gallop and no friction rub.    No murmur heard.  Pulmonary/Chest: Effort normal and breath sounds normal. No respiratory distress. He has no wheezes. He exhibits no tenderness.   Abdominal: There is tenderness. There is guarding. There is no rebound.   TTP in all four quadrants and epigastrum. Marked guarding but no rebound.    Musculoskeletal: Normal range of motion. He exhibits no edema and no tenderness.   Lymphadenopathy:     He has no cervical adenopathy.   Neurological: He is alert and oriented to person, place, and time. No cranial nerve deficit.   Skin: Skin is warm and dry. No rash noted. No erythema. No pallor.    Psychiatric: He has a normal mood and affect. His behavior is normal. Judgment and thought content normal.        MDM     Differential Diagnosis; Clinical Impression; Plan:     DDX: pancreatitis v. Alcoholic gastritis v. PUD  Amount and/or Complexity of Data Reviewed:   Clinical lab tests:  Ordered and reviewed  Tests in the radiology section of CPT??:  Ordered and reviewed   Discuss the patient with another provider:  Yes   Independant visualization of image, tracing, or specimen:  Yes      Procedures    EKG interpretation: (Preliminary)  Rhythm: normal sinus rhythm; and regular . Rate (approx.): 76; Axis: normal; P wave: normal; QRS interval: normal ; ST/T wave: T waves peaked; Other findings: possible ischemia.  Written by Sidney Ace, ED Scribe, as dictated by Shari Heritage*.        4:15PM Discussed with pt that his blood pressure was too high, would provide him with medication and follow up info. Also discussed signs of alcohol withdrawal or heart attack, that if he had chest pain, dizziness, SOB or felt shaky, he should return to the ER asap.    05:30PM Pt left room prior to being discharged while awaiting cardiac enzyme results. Discussed with nurse.

## 2012-10-13 LAB — EKG, 12 LEAD, INITIAL
Atrial Rate: 76 {beats}/min
Calculated P Axis: 43 degrees
Calculated R Axis: 25 degrees
Calculated T Axis: 38 degrees
P-R Interval: 184 ms
Q-T Interval: 370 ms
QRS Duration: 100 ms
QTC Calculation (Bezet): 416 ms
Ventricular Rate: 76 {beats}/min

## 2012-10-18 NOTE — ED Provider Notes (Signed)
I was personally available for consultation in the emergency department.  I have reviewed the chart and agree with the documentation recorded by the MLP, including the assessment, treatment plan, and disposition.  Rigby Leonhardt S Jnae Thomaston, MD

## 2012-10-18 NOTE — ED Provider Notes (Signed)
I personally saw and examined the patient.  I have reviewed and agree with the MLP's findings, including all diagnostic interpretations, and plans as written.   I was present during the key portions of separately billed procedures.    Jaloni Davoli S Trey Gulbranson, MD

## 2012-12-31 ENCOUNTER — Encounter (HOSPITAL_COMMUNITY): Payer: Self-pay | Admitting: Cardiology

## 2012-12-31 ENCOUNTER — Emergency Department (HOSPITAL_COMMUNITY)
Admission: EM | Admit: 2012-12-31 | Discharge: 2012-12-31 | Disposition: A | Discharge status: Home / Self Care | Payer: 59 | Attending: Emergency Medicine | Admitting: Emergency Medicine

## 2012-12-31 ENCOUNTER — Emergency Department (HOSPITAL_COMMUNITY): Payer: 59

## 2012-12-31 DIAGNOSIS — Z23 Encounter for immunization: Secondary | ICD-10-CM | POA: Insufficient documentation

## 2012-12-31 DIAGNOSIS — S61209A Unspecified open wound of unspecified finger without damage to nail, initial encounter: Secondary | ICD-10-CM | POA: Insufficient documentation

## 2012-12-31 DIAGNOSIS — S61219A Laceration without foreign body of unspecified finger without damage to nail, initial encounter: Secondary | ICD-10-CM

## 2012-12-31 DIAGNOSIS — S62602B Fracture of unspecified phalanx of right middle finger, initial encounter for open fracture: Secondary | ICD-10-CM

## 2012-12-31 DIAGNOSIS — W298XXA Contact with other powered powered hand tools and household machinery, initial encounter: Secondary | ICD-10-CM | POA: Insufficient documentation

## 2012-12-31 DIAGNOSIS — Y929 Unspecified place or not applicable: Secondary | ICD-10-CM | POA: Insufficient documentation

## 2012-12-31 DIAGNOSIS — Y9389 Activity, other specified: Secondary | ICD-10-CM | POA: Insufficient documentation

## 2012-12-31 DIAGNOSIS — S62609B Fracture of unspecified phalanx of unspecified finger, initial encounter for open fracture: Secondary | ICD-10-CM | POA: Insufficient documentation

## 2012-12-31 MED ORDER — TETANUS-DIPHTH-ACELL PERTUSSIS 5-2.5-18.5 LF-MCG/0.5 IM SUSP
0.5000 mL | Freq: Once | INTRAMUSCULAR | Status: AC
  Administered 2012-12-31: 0.5 mL via INTRAMUSCULAR
  Filled 2012-12-31: qty 0.5

## 2012-12-31 MED ORDER — IBUPROFEN 800 MG PO TABS: 800.0000 mg | ORAL_TABLET | Freq: Three times a day (TID) | ORAL | Status: DC

## 2012-12-31 MED ORDER — OXYCODONE-ACETAMINOPHEN 5-325 MG PO TABS: 1.0000 | ORAL_TABLET | ORAL | Status: DC | PRN

## 2012-12-31 MED ORDER — OXYCODONE-ACETAMINOPHEN 5-325 MG PO TABS
2.0000 | ORAL_TABLET | Freq: Once | ORAL | Status: AC
  Administered 2012-12-31: 2 via ORAL
  Filled 2012-12-31: qty 2

## 2012-12-31 MED ORDER — OXYCODONE-ACETAMINOPHEN 5-325 MG PO TABS
1.0000 | ORAL_TABLET | Freq: Once | ORAL | Status: AC
  Administered 2012-12-31: 1 via ORAL
  Filled 2012-12-31: qty 1

## 2012-12-31 MED ORDER — CEPHALEXIN 500 MG PO CAPS: 500.0000 mg | ORAL_CAPSULE | Freq: Two times a day (BID) | ORAL | Status: DC

## 2012-12-31 NOTE — ED Provider Notes (Signed)
History     CSN: [id number]  Arrival date & time 12/31/12  1033   First MD Initiated Contact with Patient 12/31/12 1204      Chief Complaint  Patient presents with  . Finger Injury    (Consider location/radiation/quality/duration/timing/severity/associated sxs/prior treatment) HPI Comments: 57 y.o. Male presents with 4-5 cm laceration to distal phalanx of third digit of right hand, injured with hedge trimmers about an hour ago. Bleeding fairly controlled with minimal active bleeding. Quality is jagged, shredded flapped skin. Pt denies nausea, vomiting. Admits some numbness to tip of finger. Pain is severe, constant, throbbing, and localized. No interventions were taken. Pt driven by daughter who is at bedside. Unknown tetanus status.   Patient is a 57 y.o. male presenting with skin laceration.  Laceration   History reviewed. No pertinent past medical history.  History reviewed. No pertinent past surgical history.  History reviewed. No pertinent family history.  History  Substance Use Topics  . Smoking status: Never Smoker   . Smokeless tobacco: Not on file  . Alcohol Use: No      Review of Systems  Constitutional: Negative for fever and chills.  Respiratory: Negative for chest tightness and shortness of breath.   Cardiovascular: Negative for chest pain.  Gastrointestinal: Negative for nausea and vomiting.  Musculoskeletal: Positive for arthralgias. Negative for gait problem.  Skin: Positive for wound.       Right hand, third finger tip  Neurological: Negative for light-headedness and headaches.    Allergies  Review of patient's allergies indicates no known allergies.  Home Medications   Current Outpatient Rx  Name  Route  Sig  Dispense  Refill  . Multiple Vitamin (MULTIVITAMIN WITH MINERALS) TABS   Oral   Take 1 tablet by mouth daily.         Marland Kitchen omega-3 acid ethyl esters (LOVAZA) 1 G capsule   Oral   Take 1 g by mouth daily.         Marland Kitchen OVER THE COUNTER  MEDICATION   Oral   Take 1 tablet by mouth daily. OTC gout preventative supplement.         . cephALEXin (KEFLEX) 500 MG capsule   Oral   Take 1 capsule (500 mg total) by mouth 2 (two) times daily.   20 capsule   0   . ibuprofen (ADVIL,MOTRIN) 800 MG tablet   Oral   Take 1 tablet (800 mg total) by mouth 3 (three) times daily.   21 tablet   0   . oxyCODONE-acetaminophen (PERCOCET) 5-325 MG per tablet   Oral   Take 1 tablet by mouth every 4 (four) hours as needed for pain.   12 tablet   0     BP 141/100  Pulse 53  Temp(Src) 98.1 F (36.7 C) (Oral)  Resp 18  SpO2 100%  Physical Exam  Nursing note and vitals reviewed. Constitutional: He is oriented to person, place, and time. He appears well-developed and well-nourished. No distress.  HENT:  Head: Normocephalic and atraumatic.  Eyes: Conjunctivae and EOM are normal.  Neck: Normal range of motion. Neck supple.  No meningeal signs  Cardiovascular: Normal rate and intact distal pulses.  Exam reveals no gallop and no friction rub.   No murmur heard. Pulmonary/Chest: Effort normal. No respiratory distress.  Abdominal: Soft. There is no tenderness.  Musculoskeletal: Normal range of motion. He exhibits no edema and no tenderness.  5/5 strength through out. Limited ROM to injured digit secondary to pain. No  joint or tendon involvement. Neurovascularly intact.    Neurological: He is alert and oriented to person, place, and time. No cranial nerve deficit.  No focal deficits. Sensation to light touch intact. Two pont discrimination in tact.   Skin: Skin is warm and dry. He is not diaphoretic. No erythema.  Psychiatric: He has a normal mood and affect.    ED Course  Procedures (including critical care time) Medications  oxyCODONE-acetaminophen (PERCOCET/ROXICET) 5-325 MG per tablet 1 tablet (1 tablet Oral Given 12/31/12 1121)  oxyCODONE-acetaminophen (PERCOCET/ROXICET) 5-325 MG per tablet 2 tablet (2 tablets Oral Given 12/31/12  1310)  TDaP (BOOSTRIX) injection 0.5 mL (0.5 mLs Intramuscular Given 12/31/12 1402)    LACERATION REPAIR Performed by: [facility] Nurse Authorized by: [facility] Nurse Consent: Verbal consent obtained. Risks and benefits: risks, benefits and alternatives were discussed Consent given by: patient Patient identity confirmed: provided demographic data Prepped and Draped in normal sterile fashion Wound explored  Laceration Location: distal phalanx, third digit, right hand  Laceration Length: 5cm  No Foreign Bodies seen or palpated  Anesthesia: local infiltration  Local anesthetic: lidocaine 1 % w/o epinephrine  Anesthetic total: 7 ml  Irrigation method: syringe Amount of cleaning: standard  Skin closure: 4-0 prolene  Number of sutures: 5  Technique: simple interrupted  Patient tolerance: Patient tolerated the procedure well with no immediate complications.  Discharge Medication List as of 12/31/2012  1:53 PM    START taking these medications   Details  cephALEXin (KEFLEX) 500 MG capsule Take 1 capsule (500 mg total) by mouth 2 (two) times daily., Starting 12/31/2012, Until Discontinued, Print    ibuprofen (ADVIL,MOTRIN) 800 MG tablet Take 1 tablet (800 mg total) by mouth 3 (three) times daily., Starting 12/31/2012, Until Discontinued, Print    oxyCODONE-acetaminophen (PERCOCET) 5-325 MG per tablet Take 1 tablet by mouth every 4 (four) hours as needed for pain., Starting 12/31/2012, Until Discontinued, Print       Labs Reviewed - No data to display Dg Hand Complete Right  12/31/2012  *RADIOLOGY REPORT*  Clinical Data: Hand injury, laceration to distal middle and index fingers.  RIGHT HAND - COMPLETE 3+ VIEW  Comparison: None.  Findings: Nondisplaced 3rd distal tuft fracture.  Soft tissue laceration along the radial aspect of the third distal phalanx.  Suspected soft tissue laceration along the radial aspect of the distal fourth digit.  No radiopaque foreign body is seen.   IMPRESSION: Nondisplaced 3rd distal tuft fracture.  Soft tissue laceration along the radial aspect of the third and fourth distal phalanges.  No radiopaque foreign body is seen.   Original Report Authenticated By: Charline Bills, M.D.      1. Laceration of finger of right hand, initial encounter   2. Fracture of phalanx of right middle finger, open, initial encounter       MDM  Tdap booster given. Wound cleaning complete with pressure irrigation, bottom of wound visualized, no foreign bodies appreciated, confirmed by xray which also showed nondisplaced 3rd distal tuft fracture. Neurovascularly intact. Laceration occurred < 8 hours prior to repair which was well tolerated. Pt has no co morbidities to effect normal wound healing. Discussed suture home care w pt and removal in 7 days. Pt to f-u with hand surgeon.  Pt is hemodynamically stable w no complaints prior to dc. DC home with pain meds and abx. Pt case seen by and discussed with Dr. Karma Ganja who was in agreement with plan.     [facility] Nurse, PA-C 12/31/12 1652

## 2012-12-31 NOTE — Progress Notes (Signed)
Orthopedic Tech Progress Note Patient Details:  Mitchell Burns 1955-11-23 [id number]  Ortho Devices Type of Ortho Device: Finger splint Ortho Device/Splint Interventions: Application   Cammer, Mickie Bail 12/31/2012, 1:46 PM

## 2012-12-31 NOTE — ED Notes (Signed)
Pt reports he was using some hedge trimmers and clipped the end of his finger. Reports a numb sensation to the tip of finger. Some bleeding still noted.

## 2013-01-01 NOTE — ED Provider Notes (Signed)
Medical screening examination/treatment/procedure(s) were performed by non-physician practitioner and as supervising physician I was immediately available for consultation/collaboration.  Mirian Casco K Linker, MD 01/01/13 0916 

## 2013-01-15 ENCOUNTER — Other Ambulatory Visit: Payer: Self-pay | Admitting: Orthopedic Surgery

## 2013-01-15 DIAGNOSIS — S4991XA Unspecified injury of right shoulder and upper arm, initial encounter: Secondary | ICD-10-CM

## 2013-01-18 ENCOUNTER — Ambulatory Visit
Admission: RE | Admit: 2013-01-18 | Discharge: 2013-01-18 | Disposition: A | Discharge status: Home / Self Care | Payer: 59 | Source: Ambulatory Visit | Attending: Orthopedic Surgery | Admitting: Orthopedic Surgery

## 2013-01-18 DIAGNOSIS — S4991XA Unspecified injury of right shoulder and upper arm, initial encounter: Secondary | ICD-10-CM

## 2013-01-18 MED ORDER — IOHEXOL 300 MG/ML  SOLN
100.0000 mL | Freq: Once | INTRAMUSCULAR | Status: AC | PRN
  Administered 2013-01-18: 100 mL via INTRAVENOUS

## 2013-04-12 LAB — METABOLIC PANEL, COMPREHENSIVE
A-G Ratio: 0.8 — ABNORMAL LOW (ref 1.1–2.2)
ALT (SGPT): 43 U/L (ref 12–78)
AST (SGOT): 57 U/L — ABNORMAL HIGH (ref 15–37)
Albumin: 3.6 g/dL (ref 3.5–5.0)
Alk. phosphatase: 83 U/L (ref 45–117)
Anion gap: 9 mmol/L (ref 5–15)
BUN/Creatinine ratio: 17 (ref 12–20)
BUN: 15 MG/DL (ref 6–20)
Bilirubin, total: 0.5 MG/DL (ref 0.2–1.0)
CO2: 30 mmol/L (ref 21–32)
Calcium: 8.9 MG/DL (ref 8.5–10.1)
Chloride: 103 mmol/L (ref 97–108)
Creatinine: 0.89 MG/DL (ref 0.45–1.15)
GFR est AA: >60 mL/min/{1.73_m2} (ref >60)
GFR est non-AA: >60 mL/min/{1.73_m2} (ref >60)
Globulin: 4.4 g/dL — ABNORMAL HIGH (ref 2.0–4.0)
Glucose: 96 mg/dL (ref 65–100)
Potassium: 3.5 mmol/L (ref 3.5–5.1)
Protein, total: 8 g/dL (ref 6.4–8.2)
Sodium: 142 mmol/L (ref 136–145)

## 2013-04-12 LAB — CBC WITH AUTOMATED DIFF
ABS. BASOPHILS: 0 10*3/uL (ref 0.0–0.1)
ABS. EOSINOPHILS: 0 10*3/uL (ref 0.0–0.4)
ABS. LYMPHOCYTES: 1.8 10*3/uL (ref 0.8–3.5)
ABS. MONOCYTES: 0.4 10*3/uL (ref 0.0–1.0)
ABS. NEUTROPHILS: 4.1 10*3/uL (ref 1.8–8.0)
BASOPHILS: 0 % (ref 0–1)
EOSINOPHILS: 1 % (ref 0–7)
HCT: 43.1 % (ref 36.6–50.3)
HGB: 15.7 g/dL (ref 12.1–17.0)
LYMPHOCYTES: 29 % (ref 12–49)
MCH: 33 PG (ref 26.0–34.0)
MCHC: 36.4 g/dL (ref 30.0–36.5)
MCV: 90.5 FL (ref 80.0–99.0)
MONOCYTES: 6 % (ref 5–13)
NEUTROPHILS: 64 % (ref 32–75)
PLATELET: 253 10*3/uL (ref 150–400)
RBC: 4.76 M/uL (ref 4.10–5.70)
RDW: 14.7 % — ABNORMAL HIGH (ref 11.5–14.5)
WBC: 6.4 10*3/uL (ref 4.1–11.1)

## 2013-04-12 LAB — URINALYSIS W/ REFLEX CULTURE
Bacteria: NEGATIVE /hpf
Bilirubin: NEGATIVE
Blood: NEGATIVE
Glucose: NEGATIVE mg/dL
Ketone: NEGATIVE mg/dL
Leukocyte Esterase: NEGATIVE
Nitrites: NEGATIVE
Protein: 30 mg/dL — AB
Specific gravity: 1.015 (ref 1.003–1.030)
Urobilinogen: 1 EU/dL (ref 0.2–1.0)
pH (UA): 5.5 (ref 5.0–8.0)

## 2013-04-12 LAB — CK W/ CKMB & INDEX
CK - MB: 1 NG/ML (ref 0.5–3.6)
CK-MB Index: 0.6 (ref 0.0–2.5)
CK: 163 U/L (ref 39–308)

## 2013-04-12 LAB — TROPONIN I: Troponin-I, Qt.: <0.04 ng/mL (ref <0.05)

## 2013-04-12 LAB — ETHYL ALCOHOL: ALCOHOL(ETHYL),SERUM: 79 MG/DL — ABNORMAL HIGH (ref <10)

## 2013-04-12 LAB — LIPASE: Lipase: 190 U/L (ref 73–393)

## 2013-04-12 MED ORDER — OXYCODONE-ACETAMINOPHEN 5 MG-325 MG TAB
5-325 mg | ORAL_TABLET | ORAL | Status: DC | PRN
Start: 2013-04-12 — End: 2014-06-27

## 2013-04-12 MED ORDER — HYDROMORPHONE (PF) 1 MG/ML IJ SOLN
1 mg/mL | INTRAMUSCULAR | Status: AC
Start: 2013-04-12 — End: 2013-04-12
  Administered 2013-04-12: 17:00:00 via INTRAVENOUS

## 2013-04-12 MED ORDER — ONDANSETRON (PF) 4 MG/2 ML INJECTION
4 mg/2 mL | INTRAMUSCULAR | Status: AC
Start: 2013-04-12 — End: 2013-04-12
  Administered 2013-04-12: 17:00:00 via INTRAVENOUS

## 2013-04-12 MED ORDER — IOVERSOL 350 MG/ML IV SOLN
350 mg iodine/mL | Freq: Once | INTRAVENOUS | Status: AC
Start: 2013-04-12 — End: 2013-04-12
  Administered 2013-04-12: 19:00:00 via INTRAVENOUS

## 2013-04-12 MED ORDER — SODIUM CHLORIDE 0.9% BOLUS IV
0.9 % | INTRAVENOUS | Status: AC
Start: 2013-04-12 — End: 2013-04-12
  Administered 2013-04-12: 17:00:00 via INTRAVENOUS

## 2013-04-12 MED ORDER — ONDANSETRON HCL 4 MG TAB
4 mg | ORAL_TABLET | Freq: Three times a day (TID) | ORAL | Status: DC | PRN
Start: 2013-04-12 — End: 2014-06-27

## 2013-04-12 MED ORDER — SODIUM CHLORIDE 0.9 % IJ SYRG
Freq: Once | INTRAMUSCULAR | Status: AC
Start: 2013-04-12 — End: 2013-04-12
  Administered 2013-04-12: 19:00:00 via INTRAVENOUS

## 2013-04-12 NOTE — ED Notes (Signed)
Pt given printed discharge instructions and 2 script(s).  Pt verbalized understanding of instructions and script(s).  Pt verbalized importance of following up with PCP.  Pt alert and oriented, in no acute distress, ambulatory with friend.

## 2013-04-12 NOTE — ED Notes (Signed)
Pt reports intermittent periumbilical pain x 3 days, pt states, "it comes when I drink too much."  Pt reports he had quit drinking for a year but has been drinking again x 1 month, reports he drinks beer mainly and his last beer was this morning.  Pt denies vomiting, diarrhea or constipation, denies SOB or chest pain.

## 2013-04-12 NOTE — ED Provider Notes (Signed)
HPI Comments: Kevin Davenport is a 57 y.o. male who presents ambulatory to [facility] ED with cc of 8/10 sharp periumbilical ABD pain x 3 days.  Pt reports additional sxs of subjective intermittent fever.  Pt states pain resembles sxs associated with previous pancreatitis.  Pt notes he has been drinking too much EtOH, noting he had a beer earlier this morning.  Pt specifically denies nausea, vomiting, changes in bowel movements, blood in stool, CP, lightheadedness, dizziness, syncope, or urinary sxs.      PCP: None    PMhx is significant for: pancreatitis  PShx is significant for: none  Social hx: + Smoke (0.5 ppd), + EtOH, - Drugs    There are no other complaints, changes or physical findings at this time.  Written by Terie Purser, ED Scribe, as dictated by Odis Hollingshead Dorothey Baseman, MD    The history is provided by the patient.        Past Medical History   Diagnosis Date   ??? Other ill-defined conditions      Pancreatitis        History reviewed. No pertinent past surgical history.      Family History   Problem Relation Age of Onset   ??? Hypertension Mother    ??? Hypertension Father         History     Social History   ??? Marital Status: MARRIED     Spouse Name: N/A     Number of Children: N/A   ??? Years of Education: N/A     Occupational History   ??? Not on file.     Social History Main Topics   ??? Smoking status: Current Every Day Smoker -- 0.50 packs/day   ??? Smokeless tobacco: Not on file      Comment: 1/2 ppd x 18 years   ??? Alcohol Use: Yes      Comment: Drinks an huge amount everyday unsure of ounces.   ??? Drug Use: No   ??? Sexually Active: Not on file     Other Topics Concern   ??? Not on file     Social History Narrative   ??? No narrative on file                  ALLERGIES: Review of patient's allergies indicates no known allergies.      Review of Systems   Constitutional: Positive for fever (subjective).   HENT: Negative.    Eyes: Negative.    Respiratory: Negative.  Negative for cough.    Cardiovascular: Negative.     Gastrointestinal: Positive for abdominal pain (periumbilical). Negative for nausea, vomiting, diarrhea, constipation and blood in stool.   Genitourinary: Negative.  Negative for frequency, hematuria, flank pain, decreased urine volume and difficulty urinating.   Musculoskeletal: Negative.    Skin: Negative.    Neurological: Negative.  Negative for dizziness, syncope and light-headedness.   All other systems reviewed and are negative.        Filed Vitals:    04/12/13 1254 04/12/13 1400   BP: 129/89 150/96   Pulse: 99 79   Temp: 98.6 ??F (37 ??C)    Resp: 18 16   Height: 5\' 8"  (1.727 m)    Weight: 95.255 kg (210 lb)    SpO2: 96% 97%            Physical Exam   Physical Examination: General appearance - WDWN, in no apparent distress  Head - NC/AT  Eyes - pupils equal,  round  and reactive, extraocular eye movements intact, conj/sclera clear, anicteric  Mouth - mucous membranes moist, pharynx normal without lesions  Nose/Ears - nares clear, Tms & canals clear  Neck - supple, no significant adenopathy, trachea midline, no crepitus, c spine diffusely non-tender, no step offs  Chest - Normal respiratory effort, clear to auscultation bilaterally, no wheezes/rales/rhonchi  Heart - normal rate and regular rhythm, S1 and S2 normal, no murmurs, gallops, or rubs  Abdomen - periumbilical tenderness to palpation, soft, nondistended, nabs, no masses, guarding, rebound or rigidity  Neurological - alert, oriented, normal speech, cranial nerves intact, no focal motor findings, motor & sensory diffusely intact, normal gait  Extremities - peripheral pulses normal, no pedal edema, all joints atraumatic, FROM, non-tender, no gross deformities, spine diffusely non-tender  Skin - normal coloration and turgor, no rashes, no lesions or lacerations  Written by Norvel Richards, ED Scribe, as dictated by Odis Hollingshead. Dorothey Baseman, MD    MDM     Differential Diagnosis; Clinical Impression; Plan:     DDx: pancreatitis, UTI, liver disease    Amount and/or Complexity of Data Reviewed:   Clinical lab tests:  Reviewed and ordered   Review and summarize past medical records:  Yes  Progress:   Patient progress:  Stable      Procedures    EKG interpretation: (Preliminary)  Rhythm: normal sinus rhythm; septal infarct, age undetermined; and regular . Rate (approx.): 76; Axis: normal; P wave: normal; QRS interval: normal ; ST/T wave: no acute changes; Other findings: possible ischemia.  Written by Norvel Richards, ED Scribe, as dictated by Odis Hollingshead Dorothey Baseman, MD    PROGRESS NOTE:  3:36 PM  Spoke with patient, states he is feeling better at this time.  Dicussed with patient the possibility of a mass and his need to F/U with GI for MRI.  Pt does not want to stay in the hospital and would like to go home at this time.    Written by Norvel Richards, ED Scribe, as dictated by Odis Hollingshead Dorothey Baseman, MD.    LABORATORY TESTS:  Recent Results (from the past 12 hour(s))   CBC WITH AUTOMATED DIFF    Collection Time     04/12/13  1:08 PM       Result Value Range    WBC 6.4  4.1 - 11.1 K/uL    RBC 4.76  4.10 - 5.70 M/uL    HGB 15.7  12.1 - 17.0 g/dL    HCT 16.1  09.6 - 04.5 %    MCV 90.5  80.0 - 99.0 FL    MCH 33.0  26.0 - 34.0 PG    MCHC 36.4  30.0 - 36.5 g/dL    RDW 40.9 (*) 81.1 - 14.5 %    PLATELET 253  150 - 400 K/uL    NEUTROPHILS 64  32 - 75 %    LYMPHOCYTES 29  12 - 49 %    MONOCYTES 6  5 - 13 %    EOSINOPHILS 1  0 - 7 %    BASOPHILS 0  0 - 1 %    ABS. NEUTROPHILS 4.1  1.8 - 8.0 K/UL    ABS. LYMPHOCYTES 1.8  0.8 - 3.5 K/UL    ABS. MONOCYTES 0.4  0.0 - 1.0 K/UL    ABS. EOSINOPHILS 0.0  0.0 - 0.4 K/UL    ABS. BASOPHILS 0.0  0.0 - 0.1 K/UL   METABOLIC PANEL, COMPREHENSIVE    Collection Time  04/12/13  1:08 PM       Result Value Range    Sodium 142  136 - 145 mmol/L    Potassium 3.5  3.5 - 5.1 mmol/L    Chloride 103  97 - 108 mmol/L    CO2 30  21 - 32 mmol/L    Anion gap 9  5 - 15 mmol/L    Glucose 96  65 - 100 mg/dL    BUN 15  6 - 20 MG/DL     Creatinine 4.09  8.11 - 1.15 MG/DL    BUN/Creatinine ratio 17  12 - 20      GFR est AA >60  >60 ml/min/1.64m2    GFR est non-AA >60  >60 ml/min/1.59m2    Calcium 8.9  8.5 - 10.1 MG/DL    Bilirubin, total 0.5  0.2 - 1.0 MG/DL    ALT 43  12 - 78 U/L    AST 57 (*) 15 - 37 U/L    Alk. phosphatase 83  45 - 117 U/L    Protein, total 8.0  6.4 - 8.2 g/dL    Albumin 3.6  3.5 - 5.0 g/dL    Globulin 4.4 (*) 2.0 - 4.0 g/dL    A-G Ratio 0.8 (*) 1.1 - 2.2     LIPASE    Collection Time     04/12/13  1:08 PM       Result Value Range    Lipase 190  73 - 393 U/L   ETHYL ALCOHOL    Collection Time     04/12/13  1:08 PM       Result Value Range    ALCOHOL(ETHYL),SERUM 79 (*) <10 MG/DL   CK W/ CKMB & INDEX    Collection Time     04/12/13  1:08 PM       Result Value Range    CK 163  39 - 308 U/L    CK - MB 1.0  0.5 - 3.6 NG/ML    CK-MB Index 0.6  0.0 - 2.5     TROPONIN I    Collection Time     04/12/13  1:08 PM       Result Value Range    Troponin-I, Qt. <0.04  <0.05 ng/mL   EKG, 12 LEAD, INITIAL    Collection Time     04/12/13  2:32 PM       Result Value Range    Ventricular Rate 76      Atrial Rate 76      P-R Interval 192      QRS Duration 102      Q-T Interval 406      QTC Calculation (Bezet) 456      Calculated P Axis 27      Calculated R Axis 3      Calculated T Axis 29      Diagnosis        Value: Normal sinus rhythm      Septal infarct (cited on or before 12-Apr-2013)      When compared with ECG of 09-Oct-2012 16:31,      Previous ECG has undetermined rhythm, needs review      Questionable change in initial forces of Septal leads   URINALYSIS W/ REFLEX CULTURE    Collection Time     04/12/13  2:45 PM       Result Value Range    Color YELLOW/STRAW      Appearance CLEAR  CLEAR  Specific gravity 1.015  1.003 - 1.030      pH (UA) 5.5  5.0 - 8.0      Protein 30 (*) NEG mg/dL    Glucose NEGATIVE   NEG mg/dL    Ketone NEGATIVE   NEG mg/dL    Bilirubin NEGATIVE   NEG      Blood NEGATIVE   NEG      Urobilinogen 1.0  0.2 - 1.0 EU/dL     Nitrites NEGATIVE   NEG      Leukocyte Esterase NEGATIVE   NEG      WBC 0-4  0 - 4 /hpf    RBC 0-5  0 - 5 /hpf    Epithelial cells FEW  FEW /lpf    Bacteria NEGATIVE   NEG /hpf    UA:UC IF INDICATED CULTURE NOT INDICATED BY UA RESULT  CNI         IMAGING RESULTS:  CT ABD PELV W CONT (Final result)  Result time: 04/12/13 15:15:50      Final result by Rad Results In Edi (04/12/13 15:15:50)      Narrative:    **Final Report**      ICD Codes / Adm.Diagnosis: 110002 305.01 / Abdominal Pain Abdominal pain  Examination: CT ABD PELVIS W CON - [id number] - Apr 12 2013 2:45PM  Accession No: [id number]  Reason: abdominal pain, hx pancreatitis, iv contrast only      REPORT:  EXAM: CT ABDOMEN PELVIS WITH CONTRAST    INDICATION: Abdominal pain with history of pancreatitis.    COMPARISON: 02/01/2012.    CONTRAST: 95 mL of Optiray-350.    TECHNIQUE:  Multislice helical CT was performed from the diaphragm to the symphysis   pubis during uneventful rapid bolus intravenous contrast administration. No   oral contrast was administered. Contiguous 5 mm axial images were   reconstructed and lung and soft tissue windows were generated. Coronal and   sagittal reformations were generated. CT dose reduction was achieved through   use of a standardized protocol tailored for this examination and automatic   exposure control for dose modulation.    FINDINGS:      LOWER CHEST: The visualized portions of the lung bases are clear.    ABDOMEN:  Liver: The liver is normal in size and contour with no focal abnormality.    Gallbladder and bile ducts: There is no calcified gallstone or biliary   dilatation.    Spleen: No abnormality.    Pancreas: The pancreatic head is mildly enlarged with some fluid between the   pancreas and duodenum. The body and tail of the pancreas are normal.    Adrenal glands: No abnormality.    Kidneys: No abnormality.    PELVIS:  Reproductive organs: The prostate gland and seminal vesicles are symmetrical.    Bladder: No abnormality.     BOWEL AND MESENTERY: The small bowel is normal. There is no mesenteric mass   or adenopathy. The appendix is normal.    PERITONEUM: There is no ascites or free intraperitoneal air.    RETROPERITONEUM: The aorta is atherosclerotic and tapers without aneurysm.   There is no retroperitoneal adenopathy or mass. There is no pelvic mass or   adenopathy.    BONES AND SOFT TISSUES: The bones and soft tissues of the abdominal wall are   within normal limits.      IMPRESSION:  1. Mild enlargement of the pancreatic head with fluid between the head of   the pancreas and  duodenum. This is unchanged since the prior examination and   could represent focal pancreatitis within the pancreatic head, however   clinical correlation and further evaluation if clinically indicated is   suggested to exclude a mass.  2. Atherosclerotic abdominal aorta without aneurysm.          Signing/Reading Doctor: Carmon Ginsberg [phone])   Approved: Carmon Ginsberg [phone]) Apr 12 2013 3:13PM        MEDICATIONS GIVEN:  Medications   sodium chloride 0.9 % bolus infusion 1,000 mL (0 mL IntraVENous IV Completed 04/12/13 1422)   HYDROmorphone (PF) (DILAUDID) injection 1 mg (1 mg IntraVENous Given 04/12/13 1326)   ondansetron (ZOFRAN) injection 4 mg (4 mg IntraVENous Given 04/12/13 1326)   ioversol (OPTIRAY) 350 mg iodine/mL contrast solution 100 mL (100 mL IntraVENous Given 04/12/13 1455)   sodium chloride (NS) flush 10 mL (10 mL IntraVENous Given 04/12/13 1455)       IMPRESSION:  1. Pancreatitis, acute    2. Alcohol abuse, continuous        PLAN:  1. Rx: Percocet, Zofran  2. F/U with [facility]  Return to ED if worse     DISCHARGE NOTE  3:49 PM  The patient has been re-evaluated and is ready for discharge. Reviewed available results with patient. Counseled pt on diagnosis and care plan. Pt has expressed understanding, and all questions have been answered. Pt agrees with plan and agrees to F/U as recommended, or return to the ED if their sxs worsen.  Discharge instructions have been provided and explained to the pt, along with reasons to return to the ED.  Written by Raynald Blend, ED Scribe, as dictated by Odis Hollingshead Dorothey Baseman, MD

## 2013-04-13 LAB — EKG, 12 LEAD, INITIAL
Atrial Rate: 76 {beats}/min
Calculated P Axis: 27 degrees
Calculated R Axis: 3 degrees
Calculated T Axis: 29 degrees
Diagnosis: NORMAL
P-R Interval: 192 ms
Q-T Interval: 406 ms
QRS Duration: 102 ms
QTC Calculation (Bezet): 456 ms
Ventricular Rate: 76 {beats}/min

## 2013-09-13 HISTORY — PX: POLYPECTOMY: SHX149

## 2013-09-13 HISTORY — PX: COLONOSCOPY: SHX174

## 2013-11-01 ENCOUNTER — Encounter: Payer: Self-pay | Admitting: Gastroenterology

## 2013-11-06 NOTE — ED Notes (Signed)
Called to exam room third time.  No answer

## 2013-11-06 NOTE — ED Notes (Signed)
Pt called to exam room, No answer

## 2013-11-06 NOTE — ED Notes (Signed)
Called to exam room second time.  No answer

## 2013-12-21 ENCOUNTER — Ambulatory Visit (AMBULATORY_SURGERY_CENTER): Payer: Self-pay | Admitting: *Deleted

## 2013-12-21 VITALS — Ht 70.0 in | Wt 195.8 lb

## 2013-12-21 DIAGNOSIS — Z1211 Encounter for screening for malignant neoplasm of colon: Secondary | ICD-10-CM

## 2013-12-21 MED ORDER — NA SULFATE-K SULFATE-MG SULF 17.5-3.13-1.6 GM/177ML PO SOLN: 1.0000 | Freq: Once | ORAL | Status: DC

## 2013-12-21 NOTE — Progress Notes (Signed)
No egg or soy allergy. No anesthesia problems.  

## 2013-12-26 ENCOUNTER — Encounter: Payer: Self-pay | Admitting: Gastroenterology

## 2014-01-04 ENCOUNTER — Encounter: Payer: Self-pay | Admitting: Gastroenterology

## 2014-01-04 ENCOUNTER — Ambulatory Visit (AMBULATORY_SURGERY_CENTER): Payer: Managed Care, Other (non HMO) | Admitting: Gastroenterology

## 2014-01-04 VITALS — BP 128/77 | HR 69 | Temp 97.6°F | Resp 20 | Ht 70.0 in | Wt 195.0 lb

## 2014-01-04 DIAGNOSIS — D126 Benign neoplasm of colon, unspecified: Secondary | ICD-10-CM

## 2014-01-04 DIAGNOSIS — Z1211 Encounter for screening for malignant neoplasm of colon: Secondary | ICD-10-CM

## 2014-01-04 MED ORDER — SODIUM CHLORIDE 0.9 % IV SOLN: 500.0000 mL | INTRAVENOUS | Status: DC

## 2014-01-04 NOTE — Op Note (Signed)
[facility]  [address] [place], [id number]   COLONOSCOPY PROCEDURE REPORT  PATIENT: Mitchell Burns, Mitchell Burns  MR#: [id number] BIRTHDATE: Jun 23, 1956 , 33  yrs. old GENDER: Male ENDOSCOPIST: Inda Castle, MD REFERRED BY: PROCEDURE DATE:  01/04/2014 PROCEDURE:   Colonoscopy with snare polypectomy First Screening Colonoscopy - Avg.  risk and is 50 yrs.  old or older Yes.  Prior Negative Screening - Now for repeat screening. N/A  History of Adenoma - Now for follow-up colonoscopy & has been > or = to 3 yrs.  N/A  Polyps Removed Today? Yes. ASA CLASS:   Class I INDICATIONS:average risk screening. MEDICATIONS: MAC sedation, administered by CRNA and propofol (Diprivan) 200mg  IV  DESCRIPTION OF PROCEDURE:   After the risks benefits and alternatives of the procedure were thoroughly explained, informed consent was obtained.  A digital rectal exam revealed no abnormalities of the rectum.   The LB [id number]  endoscope was introduced through the anus and advanced to the cecum, which was identified by both the appendix and ileocecal valve. No adverse events experienced.   The quality of the prep was Suprep good  The instrument was then slowly withdrawn as the colon was fully examined.      COLON FINDINGS: A sessile polyp measuring 8 mm in size was found in the ascending colon.  A polypectomy was performed with a cold snare.  The resection was complete and the polyp tissue was completely retrieved.   The colon was otherwise normal.  There was no diverticulosis, inflammation, polyps or cancers unless previously stated.  Retroflexed views revealed no abnormalities. The time to cecum=2 minutes 05 seconds.  Withdrawal time=9 minutes 25 seconds.  The scope was withdrawn and the procedure completed. COMPLICATIONS: There were no complications.  ENDOSCOPIC IMPRESSION: 1.   Sessile polyp measuring 8 mm in size was found in the ascending colon; polypectomy was performed with  a cold snare 2.   The colon was otherwise normal  RECOMMENDATIONS: If the polyp(s) removed today are proven to be adenomatous (pre-cancerous) polyps, you will need a repeat colonoscopy in 5 years.  Otherwise you should continue to follow colorectal cancer screening guidelines for "routine risk" patients with colonoscopy in 10 years.  You will receive a letter within 1-2 weeks with the results of your biopsy as well as final recommendations.  Please call my office if you have not received a letter after 3 weeks.   eSigned:  Inda Castle, MD 01/04/2014 3:11 PM   cc: Amador Cunas, MD   PATIENT NAME:  [name], [name] MR#: [id number]

## 2014-01-04 NOTE — Progress Notes (Signed)
Called to room to assist during endoscopic procedure.  Patient ID and intended procedure confirmed with present staff. Received instructions for my participation in the procedure from the performing physician.  

## 2014-01-04 NOTE — Progress Notes (Signed)
  [facility] Post-op Note  Patient: Mitchell Burns  Procedure(s) Performed: colonoscopy  Patient Location: [facility] - Recovery Area  Anesthesia Type: Deep Sedation/Propofol  Level of Consciousness: awake, oriented and patient cooperative  Airway and Oxygen Therapy: Patient Spontanous Breathing  Post-op Pain: none  Post-op Assessment:  Post-op Vital signs reviewed, Patient's Cardiovascular Status Stable, Respiratory Function Stable, Patent Airway, No signs of Nausea or vomiting and Pain level controlled  Post-op Vital Signs: Reviewed and stable  Complications: No apparent anesthesia complications  [name] E [name] 3:12 PM

## 2014-01-04 NOTE — Patient Instructions (Signed)
Information sheet given on polyps.  YOU HAD AN ENDOSCOPIC PROCEDURE TODAY AT THE [facility]: Refer to the procedure report that was given to you for any specific questions about what was found during the examination.  If the procedure report does not answer your questions, please call your gastroenterologist to clarify.  If you requested that your care partner not be given the details of your procedure findings, then the procedure report has been included in a sealed envelope for you to review at your convenience later.  YOU SHOULD EXPECT: Some feelings of bloating in the abdomen. Passage of more gas than usual.  Walking can help get rid of the air that was put into your GI tract during the procedure and reduce the bloating. If you had a lower endoscopy (such as a colonoscopy or flexible sigmoidoscopy) you may notice spotting of blood in your stool or on the toilet paper. If you underwent a bowel prep for your procedure, then you may not have a normal bowel movement for a few days.  DIET: Your first meal following the procedure should be a light meal and then it is ok to progress to your normal diet.  A half-sandwich or bowl of soup is an example of a good first meal.  Heavy or fried foods are harder to digest and may make you feel nauseous or bloated.  Likewise meals heavy in dairy and vegetables can cause extra gas to form and this can also increase the bloating.  Drink plenty of fluids but you should avoid alcoholic beverages for 24 hours.  ACTIVITY: Your care partner should take you home directly after the procedure.  You should plan to take it easy, moving slowly for the rest of the day.  You can resume normal activity the day after the procedure however you should NOT DRIVE or use heavy machinery for 24 hours (because of the sedation medicines used during the test).    SYMPTOMS TO REPORT IMMEDIATELY: A gastroenterologist can be reached at any hour.  During normal business hours, 8:30  AM to 5:00 PM Monday through Friday, call [phone].  After hours and on weekends, please call the GI answering service at [phone] who will take a message and have the physician on call contact you.   Following lower endoscopy (colonoscopy or flexible sigmoidoscopy):  Excessive amounts of blood in the stool  Significant tenderness or worsening of abdominal pains  Swelling of the abdomen that is new, acute  Fever of 100F or higher  FOLLOW UP: If any biopsies were taken you will be contacted by phone or by letter within the next 1-3 weeks.  Call your gastroenterologist if you have not heard about the biopsies in 3 weeks.  Our staff will call the home number listed on your records the next business day following your procedure to check on you and address any questions or concerns that you may have at that time regarding the information given to you following your procedure. This is a courtesy call and so if there is no answer at the home number and we have not heard from you through the emergency physician on call, we will assume that you have returned to your regular daily activities without incident.  SIGNATURES/CONFIDENTIALITY: You and/or your care partner have signed paperwork which will be entered into your electronic medical record.  These signatures attest to the fact that that the information above on your After Visit Summary has been reviewed and is understood.  Full   responsibility of the confidentiality of this discharge information lies with you and/or your care-partner.

## 2014-01-07 ENCOUNTER — Telehealth: Payer: Self-pay

## 2014-01-07 NOTE — Telephone Encounter (Signed)
  Follow up Call-  Call back number 01/04/2014  Post procedure Call Back phone  # [phone]  Permission to leave phone message Yes     Patient questions:  Do you have a fever, pain , or abdominal swelling? no Pain Score  0 *  Have you tolerated food without any problems? yes  Have you been able to return to your normal activities? yes  Do you have any questions about your discharge instructions: Diet   no Medications  no Follow up visit  no  Do you have questions or concerns about your Care? no  Actions: * If pain score is 4 or above: No action needed, pain <4.   No problems per the pt. Maw

## 2014-01-09 ENCOUNTER — Encounter: Payer: Self-pay | Admitting: Gastroenterology

## 2014-02-03 NOTE — ED Notes (Signed)
Backboard removed by provider.

## 2014-02-03 NOTE — ED Notes (Signed)
Patient removed c-collar himself. Provider made aware. Patient at bedside.

## 2014-02-03 NOTE — ED Notes (Signed)
Patient reports to ED by EMS. Patient reports he has been drinking " a lot today." Patient smells of ETOH. Patient reports falling down his front steps because "of his stupidity." Patient is on backboard and in a c-collar placed by EMS. Patient has a small laceration to inside of lower lip. Patient c/o lower back pain. Patient in NAD.

## 2014-02-03 NOTE — ED Notes (Signed)
Patient reports he would like to leave. Provider informed. Patient signed AMA form. Patient steady on his feet, A&Ox 4. Patient reports his brother is coming to pick him up.

## 2014-02-03 NOTE — ED Provider Notes (Signed)
Patient is a 58 y.o. male presenting with intoxication. The history is provided by the patient. No language interpreter was used.   Alcohol intoxication  Primary symptoms include: intoxication.  Suspected agents include alcohol. Pertinent negatives include no fever.        Past Medical History   Diagnosis Date   ??? Other ill-defined conditions(799.89)      Pancreatitis        History reviewed. No pertinent past surgical history.      Family History   Problem Relation Age of Onset   ??? Hypertension Mother    ??? Hypertension Father         History     Social History   ??? Marital Status: MARRIED     Spouse Name: N/A     Number of Children: N/A   ??? Years of Education: N/A     Occupational History   ??? Not on file.     Social History Main Topics   ??? Smoking status: Current Every Day Smoker -- 0.50 packs/day   ??? Smokeless tobacco: Not on file      Comment: 1/2 ppd x 18 years   ??? Alcohol Use: Yes      Comment: Drinks an huge amount everyday unsure of ounces.   ??? Drug Use: No   ??? Sexual Activity: Not on file     Other Topics Concern   ??? Not on file     Social History Narrative                  ALLERGIES: Review of patient's allergies indicates no known allergies.      Review of Systems   Constitutional: Negative for fever.   HENT: Negative for congestion.    Eyes: Positive for visual disturbance.   Respiratory: Negative for shortness of breath and wheezing.    Gastrointestinal: Negative for abdominal pain.   Endocrine: Negative for polyuria.   Genitourinary: Negative for dysuria.   Musculoskeletal: Negative for back pain.   Skin: Negative for rash.   Allergic/Immunologic: Negative for immunocompromised state.   Neurological: Negative for headaches.       Filed Vitals:    02/03/14 2207   BP: 156/95   Pulse: 92   Temp: 98.4 ??F (36.9 ??C)   Resp: 18   Height: 5\' 8"  (1.727 m)   Weight: 95.255 kg (210 lb)   SpO2: 96%            Physical Exam   Constitutional: He is oriented to person, place, and time. He appears well-developed and  well-nourished.   HENT:   Head: Atraumatic.   Eyes: EOM are normal.   Neck: Neck supple.   Cardiovascular: Normal rate and regular rhythm.  Exam reveals no gallop and no friction rub.    No murmur heard.  Pulmonary/Chest: Effort normal and breath sounds normal. No respiratory distress. He has no wheezes. He has no rales.   Abdominal: Soft. Bowel sounds are normal. He exhibits no distension.   Musculoskeletal: He exhibits no edema or tenderness.   Neurological: He is alert and oriented to person, place, and time.   Skin: Skin is warm and dry.   Psychiatric: He has a normal mood and affect.   Nursing note and vitals reviewed.       MDM    Procedures

## 2014-02-04 MED ORDER — AMOXICILLIN 250 MG CAP
250 mg | ORAL | Status: DC
Start: 2014-02-04 — End: 2014-02-03

## 2014-02-04 MED ORDER — ACETAMINOPHEN-CODEINE 300 MG-30 MG TAB
300-30 mg | ORAL | Status: DC
Start: 2014-02-04 — End: 2014-02-03

## 2014-02-04 MED ORDER — SODIUM CHLORIDE 0.9 % IV
3300 unit- 150 mcg/10 mL | INTRAVENOUS | Status: DC
Start: 2014-02-04 — End: 2014-02-03

## 2014-06-27 ENCOUNTER — Inpatient Hospital Stay
Admit: 2014-06-27 | Discharge: 2014-06-27 | Disposition: A | Discharge status: Other Acute Inpatient Hospital | Payer: PRIVATE HEALTH INSURANCE | Attending: Emergency Medicine

## 2014-06-27 DIAGNOSIS — R1031 Right lower quadrant pain: Secondary | ICD-10-CM

## 2014-06-27 LAB — METABOLIC PANEL, COMPREHENSIVE
A-G Ratio: 0.9 — ABNORMAL LOW (ref 1.1–2.2)
ALT (SGPT): 34 U/L (ref 12–78)
AST (SGOT): 29 U/L (ref 15–37)
Albumin: 3.7 g/dL (ref 3.5–5.0)
Alk. phosphatase: 69 U/L (ref 45–117)
Anion gap: 10 mmol/L (ref 5–15)
BUN/Creatinine ratio: 9 — ABNORMAL LOW (ref 12–20)
BUN: 8 MG/DL (ref 6–20)
Bilirubin, total: 0.5 MG/DL (ref 0.2–1.0)
CO2: 27 mmol/L (ref 21–32)
Calcium: 8.8 MG/DL (ref 8.5–10.1)
Chloride: 103 mmol/L (ref 97–108)
Creatinine: 0.88 MG/DL (ref 0.70–1.30)
GFR est AA: >60 mL/min/{1.73_m2} (ref >60)
GFR est non-AA: >60 mL/min/{1.73_m2} (ref >60)
Globulin: 4.2 g/dL — ABNORMAL HIGH (ref 2.0–4.0)
Glucose: 104 mg/dL — ABNORMAL HIGH (ref 65–100)
Potassium: 3.4 mmol/L — ABNORMAL LOW (ref 3.5–5.1)
Protein, total: 7.9 g/dL (ref 6.4–8.2)
Sodium: 140 mmol/L (ref 136–145)

## 2014-06-27 LAB — URINALYSIS W/ REFLEX CULTURE
Bacteria: NEGATIVE /hpf
Bilirubin: NEGATIVE
Blood: NEGATIVE
Glucose: NEGATIVE mg/dL
Ketone: NEGATIVE mg/dL
Leukocyte Esterase: NEGATIVE
Nitrites: NEGATIVE
Protein: 30 mg/dL — AB
Specific gravity: 1.015 (ref 1.003–1.030)
Urobilinogen: 0.2 EU/dL (ref 0.2–1.0)
pH (UA): 5.5 (ref 5.0–8.0)

## 2014-06-27 LAB — CBC WITH AUTOMATED DIFF
ABS. BASOPHILS: 0 10*3/uL (ref 0.0–0.1)
ABS. EOSINOPHILS: 0.1 10*3/uL (ref 0.0–0.4)
ABS. LYMPHOCYTES: 1.3 10*3/uL (ref 0.8–3.5)
ABS. MONOCYTES: 0.6 10*3/uL (ref 0.0–1.0)
ABS. NEUTROPHILS: 5.3 10*3/uL (ref 1.8–8.0)
BASOPHILS: 0 % (ref 0–1)
EOSINOPHILS: 1 % (ref 0–7)
HCT: 42.6 % (ref 36.6–50.3)
HGB: 15.1 g/dL (ref 12.1–17.0)
LYMPHOCYTES: 18 % (ref 12–49)
MCH: 31.8 PG (ref 26.0–34.0)
MCHC: 35.4 g/dL (ref 30.0–36.5)
MCV: 89.7 FL (ref 80.0–99.0)
MONOCYTES: 8 % (ref 5–13)
NEUTROPHILS: 73 % (ref 32–75)
PLATELET: 225 10*3/uL (ref 150–400)
RBC: 4.75 M/uL (ref 4.10–5.70)
RDW: 12.5 % (ref 11.5–14.5)
WBC: 7.2 10*3/uL (ref 4.1–11.1)

## 2014-06-27 LAB — LIPASE: Lipase: 308 U/L (ref 73–393)

## 2014-06-27 MED ORDER — IBUPROFEN 800 MG TAB
800 mg | ORAL_TABLET | Freq: Four times a day (QID) | ORAL | Status: AC | PRN
Start: 2014-06-27 — End: 2014-07-04

## 2014-06-27 MED ORDER — SODIUM CHLORIDE 0.9 % IJ SYRG
INTRAMUSCULAR | Status: DC | PRN
Start: 2014-06-27 — End: 2014-06-27
  Administered 2014-06-27 (×2): via INTRAVENOUS

## 2014-06-27 MED ORDER — KETOROLAC TROMETHAMINE 30 MG/ML INJECTION
30 mg/mL (1 mL) | INTRAMUSCULAR | Status: AC
Start: 2014-06-27 — End: 2014-06-27
  Administered 2014-06-27: 15:00:00 via INTRAVENOUS

## 2014-06-27 MED FILL — KETOROLAC TROMETHAMINE 30 MG/ML INJECTION: 30 mg/mL (1 mL) | INTRAMUSCULAR | Qty: 1

## 2014-06-27 NOTE — ED Notes (Signed)
Discharge instructions and prescriptions reviewed with patient. Medication education provided on (1) scripts. Verbalized understanding. Discharged ambulatory with no distress noted.

## 2014-06-27 NOTE — ED Notes (Signed)
Patient reports with bilateral flank, abdominal pain since last evening.  Patient reports he was working in the yard doing lifting and pulling prior to the pain starting.  Patient denies nausea, vomiting or change in urine/bowel pattern.

## 2014-06-27 NOTE — ED Notes (Signed)
Patient was provided a specimen cup to provide urine sample

## 2014-06-27 NOTE — ED Provider Notes (Signed)
Patient is a 58 y.o. male presenting with flank pain. The history is provided by the patient.   Flank Pain   This is a new problem. The current episode started yesterday. The problem has not changed since onset.The problem occurs constantly. Patient reports not work related injury.Associated with: pt admits to heavy lifting and pulling yesterday but denies any trauma. Pain location: bilateral flank pain. Radiates to: to the lower back sometimes. The pain is at a severity of 8/10. Associated symptoms include abdominal pain. Pertinent negatives include no fever, no bowel incontinence, no bladder incontinence, no dysuria, no paresthesias, no paresis and no tingling. He has tried nothing for the symptoms. The patient's surgical history non-contributory        Past Medical History   Diagnosis Date   ??? Other ill-defined conditions(799.89)      Pancreatitis        History reviewed. No pertinent past surgical history.      Family History   Problem Relation Age of Onset   ??? Hypertension Mother    ??? Hypertension Father         History     Social History   ??? Marital Status: MARRIED     Spouse Name: N/A     Number of Children: N/A   ??? Years of Education: N/A     Occupational History   ??? Not on file.     Social History Main Topics   ??? Smoking status: Current Every Day Smoker -- 0.50 packs/day   ??? Smokeless tobacco: Not on file      Comment: 1/2 ppd x 18 years   ??? Alcohol Use: Yes      Comment: Drinks an huge amount everyday unsure of ounces.   ??? Drug Use: No   ??? Sexual Activity: Not on file     Other Topics Concern   ??? Not on file     Social History Narrative                  ALLERGIES: Review of patient's allergies indicates no known allergies.      Review of Systems   Constitutional: Negative for fever.   Gastrointestinal: Positive for abdominal pain. Negative for nausea, vomiting, diarrhea, constipation and bowel incontinence.   Genitourinary: Positive for flank pain. Negative for bladder incontinence,  dysuria and hematuria.   Neurological: Negative for tingling and paresthesias.   All other systems reviewed and are negative.      Filed Vitals:    06/27/14 0910   BP: 183/96   Pulse: 81   Temp: 97.9 ??F (36.6 ??C)   Resp: 20   Height: 5\' 8"  (1.727 m)   Weight: 88.451 kg (195 lb)   SpO2: 99%            Physical Exam   Constitutional: He is oriented to person, place, and time. He appears well-developed and well-nourished. No distress.   HENT:   Head: Normocephalic and atraumatic.   Eyes: Conjunctivae are normal.   Cardiovascular: Normal rate, regular rhythm and normal heart sounds.    Pulmonary/Chest: Effort normal and breath sounds normal. No respiratory distress. He has no wheezes. He has no rales.   Abdominal: Soft. Bowel sounds are normal. There is tenderness in the right lower quadrant and left lower quadrant. There is no rigidity, no rebound and no guarding.   Musculoskeletal: Normal range of motion.   Neurological: He is alert and oriented to person, place, and time.   Skin: Skin  is warm and dry.   Vitals reviewed.       MDM  Number of Diagnoses or Management Options  Diagnosis management comments: DDX: UTI, nephrolithiasis, appendicitis, mm strain    Progress Note 11:45a  Pt reexamined, feeling better.  Discussed labs and CT findings with pt       Amount and/or Complexity of Data Reviewed  Clinical lab tests: ordered and reviewed  Tests in the radiology section of CPT??: ordered and reviewed        Procedures

## 2014-07-13 DIAGNOSIS — K859 Acute pancreatitis, unspecified: Secondary | ICD-10-CM

## 2014-07-13 NOTE — ED Notes (Signed)
Pt arrived in ED by way of EMS w/ the complaint of difficulty breathing and L sided abd pain X 3 days. Pt states he has a history of pancreatitis but doesn't believe the pain is due to that. Pt is A&O X 4 and appears in no distress.

## 2014-07-13 NOTE — ED Notes (Signed)
Report given to Clydell HakimNoel T. RN

## 2014-07-13 NOTE — ED Notes (Signed)
Provider at bedside.

## 2014-07-13 NOTE — ED Provider Notes (Addendum)
Patient is a 58 y.o. male presenting with abdominal pain. The history is provided by the patient. No language interpreter was used.   Abdominal Pain   This is a new problem. The problem occurs constantly. The problem has not changed since onset.The pain is located in the LUQ. The pain is at a severity of 4/10. The pain is moderate. Pertinent negatives include no fever, no nausea, no vomiting, no dysuria, no headaches and no back pain. Nothing worsens the pain. The pain is relieved by nothing. His past medical history does not include PUD.                                    Past Medical History   Diagnosis Date   ??? Other ill-defined conditions(799.89)      Pancreatitis   ;     No past surgical history on file.      Family History   Problem Relation Age of Onset   ??? Hypertension Mother    ??? Hypertension Father         History     Social History   ??? Marital Status: MARRIED     Spouse Name: N/A     Number of Children: N/A   ??? Years of Education: N/A     Occupational History   ??? Not on file.     Social History Main Topics   ??? Smoking status: Current Every Day Smoker -- 0.50 packs/day   ??? Smokeless tobacco: Not on file      Comment: 1/2 ppd x 18 years   ??? Alcohol Use: Yes      Comment: Drinks an huge amount everyday unsure of ounces.   ??? Drug Use: No   ??? Sexual Activity: Not on file     Other Topics Concern   ??? Not on file     Social History Narrative                  ALLERGIES: Review of patient's allergies indicates no known allergies.      Review of Systems   Constitutional: Negative for fever.   HENT: Negative for sore throat.    Eyes: Negative for visual disturbance.   Respiratory: Negative for shortness of breath and wheezing.    Gastrointestinal: Positive for abdominal pain. Negative for nausea and vomiting.   Endocrine: Negative for polyuria.   Genitourinary: Negative for dysuria.   Musculoskeletal: Negative for back pain.   Skin: Negative for rash.    Allergic/Immunologic: Negative for immunocompromised state.   Neurological: Negative for headaches.   Psychiatric/Behavioral: Negative.  Negative for agitation.       Filed Vitals:    07/13/14 2239 07/13/14 2245   BP: 143/80    Pulse: 106    Temp: 98.2 ??F (36.8 ??C) 98.2 ??F (36.8 ??C)   SpO2: 96%             Physical Exam   Constitutional: He is oriented to person, place, and time. He appears well-developed and well-nourished.   HENT:   Head: Atraumatic.   Eyes: EOM are normal.   Neck: Normal range of motion. Neck supple.   Cardiovascular: Normal rate and regular rhythm.  Exam reveals no gallop and no friction rub.    No murmur heard.  Pulmonary/Chest: Effort normal and breath sounds normal. No respiratory distress. He has no wheezes. He has no rales.   Abdominal: Soft.  Bowel sounds are normal.   Musculoskeletal: He exhibits no edema or tenderness.   Neurological: He is alert and oriented to person, place, and time.   Skin: Skin is warm and dry.   Psychiatric: He has a normal mood and affect.   Nursing note and vitals reviewed.       MDM                        Procedures

## 2014-07-14 ENCOUNTER — Inpatient Hospital Stay
Admit: 2014-07-14 | Discharge: 2014-07-14 | Disposition: A | Discharge status: Home / Self Care | Payer: PRIVATE HEALTH INSURANCE | Attending: Emergency Medicine

## 2014-07-14 LAB — CBC WITH AUTOMATED DIFF
ABS. BASOPHILS: 0.1 10*3/uL (ref 0.0–0.1)
ABS. EOSINOPHILS: 0.1 10*3/uL (ref 0.0–0.4)
ABS. LYMPHOCYTES: 4.2 10*3/uL — ABNORMAL HIGH (ref 0.8–3.5)
ABS. MONOCYTES: 0.6 10*3/uL (ref 0.0–1.0)
ABS. NEUTROPHILS: 8.3 10*3/uL — ABNORMAL HIGH (ref 1.8–8.0)
BASOPHILS: 0 % (ref 0–1)
EOSINOPHILS: 1 % (ref 0–7)
HCT: 39.5 % (ref 36.6–50.3)
HGB: 14.3 g/dL (ref 12.1–17.0)
LYMPHOCYTES: 32 % (ref 12–49)
MCH: 31.8 PG (ref 26.0–34.0)
MCHC: 36.2 g/dL (ref 30.0–36.5)
MCV: 88 FL (ref 80.0–99.0)
MONOCYTES: 5 % (ref 5–13)
NEUTROPHILS: 62 % (ref 32–75)
PLATELET: 383 10*3/uL (ref 150–400)
RBC: 4.49 M/uL (ref 4.10–5.70)
RDW: 12.9 % (ref 11.5–14.5)
WBC: 13.3 10*3/uL — ABNORMAL HIGH (ref 4.1–11.1)

## 2014-07-14 LAB — METABOLIC PANEL, COMPREHENSIVE
A-G Ratio: 0.8 — ABNORMAL LOW (ref 1.1–2.2)
ALT (SGPT): 31 U/L (ref 12–78)
AST (SGOT): 28 U/L (ref 15–37)
Albumin: 3.3 g/dL — ABNORMAL LOW (ref 3.5–5.0)
Alk. phosphatase: 63 U/L (ref 45–117)
Anion gap: 18 mmol/L — ABNORMAL HIGH (ref 5–15)
BUN/Creatinine ratio: 12 (ref 12–20)
BUN: 17 MG/DL (ref 6–20)
Bilirubin, total: 0.2 MG/DL (ref 0.2–1.0)
CO2: 21 mmol/L (ref 21–32)
Calcium: 8.4 MG/DL — ABNORMAL LOW (ref 8.5–10.1)
Chloride: 108 mmol/L (ref 97–108)
Creatinine: 1.43 MG/DL — ABNORMAL HIGH (ref 0.70–1.30)
GFR est AA: >60 mL/min/{1.73_m2} (ref >60)
GFR est non-AA: 50 mL/min/{1.73_m2} — ABNORMAL LOW (ref >60)
Globulin: 4.2 g/dL — ABNORMAL HIGH (ref 2.0–4.0)
Glucose: 108 mg/dL — ABNORMAL HIGH (ref 65–100)
Potassium: 3.2 mmol/L — ABNORMAL LOW (ref 3.5–5.1)
Protein, total: 7.5 g/dL (ref 6.4–8.2)
Sodium: 147 mmol/L — ABNORMAL HIGH (ref 136–145)

## 2014-07-14 LAB — LIPASE: Lipase: 522 U/L — ABNORMAL HIGH (ref 73–393)

## 2014-07-14 LAB — ETHYL ALCOHOL: ALCOHOL(ETHYL),SERUM: 141 MG/DL — ABNORMAL HIGH (ref <10)

## 2014-07-14 MED ORDER — FAMOTIDINE 20 MG TAB
20 mg | ORAL | Status: AC
Start: 2014-07-14 — End: 2014-07-13
  Administered 2014-07-14: 03:00:00 via ORAL

## 2014-07-14 MED ORDER — LIDOCAINE 2 % MUCOSAL SOLN
2 % | Freq: Once | Status: AC
Start: 2014-07-14 — End: 2014-07-13
  Administered 2014-07-14: 03:00:00 via ORAL

## 2014-07-14 MED ORDER — SODIUM CHLORIDE 0.9% BOLUS IV
0.9 % | INTRAVENOUS | Status: AC
Start: 2014-07-14 — End: 2014-07-14
  Administered 2014-07-14: 03:00:00 via INTRAVENOUS

## 2014-07-14 MED ORDER — PREDNISONE 20 MG TAB
20 mg | ORAL | Status: AC
Start: 2014-07-14 — End: 2014-07-14
  Administered 2014-07-14: 04:00:00 via ORAL

## 2014-07-14 MED ORDER — PREDNISONE 20 MG TAB
20 mg | ORAL_TABLET | Freq: Every day | ORAL | Status: AC
Start: 2014-07-14 — End: 2014-07-17

## 2014-07-14 MED ORDER — SALINE PERIPHERAL FLUSH Q8H
INTRAMUSCULAR | Status: DC | PRN
Start: 2014-07-14 — End: 2014-07-14

## 2014-07-14 MED FILL — MAG-AL PLUS 200 MG-200 MG-20 MG/5 ML ORAL SUSPENSION: 200-200-20 mg/5 mL | ORAL | Qty: 30

## 2014-07-14 MED FILL — SODIUM CHLORIDE 0.9 % IV: INTRAVENOUS | Qty: 1000

## 2014-07-14 MED FILL — PREDNISONE 20 MG TAB: 20 mg | ORAL | Qty: 3

## 2014-07-14 MED FILL — FAMOTIDINE 20 MG TAB: 20 mg | ORAL | Qty: 1

## 2014-07-14 NOTE — ED Notes (Signed)
Pt refused to stay for admission, pt signed AMA form.

## 2014-07-14 NOTE — ED Notes (Signed)
Patient (s) was given copy of dc instructions and one script(s).  Patient (s) has verbalized understanding of instructions and script (s).  Patient given a current medication reconciliation form and verbalized understanding of their medications.   Patient (s) has verbalized understanding of the importance of discussing medications with  his or her physician or clinic they will be following up with.  Patient alert and oriented and in no acute distress.  Patient discharged home ambulatory with family.

## 2014-11-15 ENCOUNTER — Ambulatory Visit
Admit: 2014-11-15 | Discharge: 2014-11-15 | Payer: PRIVATE HEALTH INSURANCE | Attending: Internal Medicine | Primary: Family Medicine

## 2014-11-15 DIAGNOSIS — Z0000 Encounter for general adult medical examination without abnormal findings: Secondary | ICD-10-CM

## 2014-11-15 LAB — AMB POC URINALYSIS DIP STICK AUTO W/O MICRO
Bilirubin (UA POC): NEGATIVE
Blood (UA POC): NEGATIVE
Glucose (UA POC): NEGATIVE
Ketones (UA POC): NEGATIVE
Nitrites (UA POC): NEGATIVE
Specific gravity (UA POC): 1.01 (ref 1.001–1.035)
pH (UA POC): 5.5 (ref 4.6–8.0)

## 2014-11-15 NOTE — Patient Instructions (Addendum)
Vaccine Information Statement     Tdap (Tetanus, Diphtheria, Pertussis) Vaccine: What You Need to Know    Many Vaccine Information Statements are available in Spanish and other languages. See www.immunize.org/vis.  Hojas de Informaci??n Sobre Vacunas est??n disponibles en espa??ol y en muchos otros idiomas. Visite http://www.immunize.org/vis    1. Why get vaccinated?    Tetanus, diphtheria, and pertussis are very serious diseases. Tdap vaccine can protect us from these diseases.  And, Tdap vaccine given to pregnant women can protect newborn babies against pertussis.    TETANUS (Lockjaw) is rare in the [place] today. It causes painful muscle tightening and stiffness, usually all over the body.  ? It can lead to tightening of muscles in the head and neck so you can???t open your mouth, swallow, or sometimes even breathe. Tetanus kills about 1 out of 10 people who are infected even after receiving the best medical care.      DIPHTHERIA is also rare in the [place] today.  It can cause a thick coating to form in the back of the throat.  ? It can lead to breathing problems, heart failure, paralysis, and death.    PERTUSSIS (Whooping Cough) causes severe coughing spells, which can cause difficulty breathing, vomiting, and disturbed sleep.  ? It can also lead to weight loss, incontinence, and rib fractures. Up to 2 in 100 adolescents and 5 in 100 adults with pertussis are hospitalized or have complications, which could include pneumonia or death.     These diseases are caused by bacteria. Diphtheria and pertussis are spread from person to person through secretions from coughing or sneezing. Tetanus enters the body through cuts, scratches, or wounds.    Before vaccines, as many as 200,000 cases of diphtheria, 200,000 cases of pertussis, and hundreds of cases of tetanus, were reported in the [place] each year. Since vaccination began, reports of cases for tetanus  and diphtheria have dropped by about 99% and for pertussis by about 80%.    2. Tdap vaccine    Tdap vaccine can protect adolescents and adults from tetanus, diphtheria, and pertussis. One dose of Tdap is routinely given at age 11 or 12.  People who did not get Tdap at that age should get it as soon as possible.    Tdap is especially important for health care professionals and anyone having close contact with a baby younger than 12 months.      Pregnant women should get a dose of Tdap during every pregnancy, to protect the newborn from pertussis.  Infants are most at risk for severe, life-threatening complications from pertussis.    Another vaccine, called Td, protects against tetanus and diphtheria, but not pertussis. A Td booster should be given every 10 years. Tdap may be given as one of these boosters if you have never gotten Tdap before.  Tdap may also be given after a severe cut or burn to prevent tetanus infection.    Your doctor or the person giving you the vaccine can give you more information.    Tdap may safely be given at the same time as other vaccines.    3. Some people should not get this vaccine    ??? A person who has ever had a life-threatening allergic reaction after a previous dose of any diphtheria, tetanus or pertussis containing vaccine, OR has a severe allergy to any part of this vaccine, should not get Tdap vaccine.  Tell the person giving the vaccine about any severe allergies.    ???   Anyone who had coma or long repeated seizures within 7 days after a childhood dose of DTP or DTaP, or a previous dose of Tdap, should not get Tdap, unless a cause other than the vaccine was found.  They can still get Td.    ??? Talk to your doctor if you:  - have seizures or another nervous system problem,  - had severe pain or swelling after any vaccine containing diphtheria, tetanus or pertussis,   - ever had a condition called Guillain Barr?? Syndrome (GBS),  - aren???t feeling well on the day the shot is scheduled.     4. Risks    With any medicine, including vaccines, there is a chance of side effects. These are usually mild and go away on their own. Serious reactions are also possible but are rare.     Most people who get Tdap vaccine do not have any problems with it.    Mild Problems following Tdap  (Did not interfere with activities)  ??? Pain where the shot was given (about 3 in 4 adolescents or 2 in 3 adults)  ??? Redness or swelling where the shot was given (about 1 person in 5)  ??? Mild fever of at least 100.4??F (up to about 1 in 25 adolescents or 1 in 100 adults)  ??? Headache (about 3 or 4 people in 10)  ??? Tiredness (about 1 person in 3 or 4)  ??? Nausea, vomiting, diarrhea, stomach ache (up to 1 in 4 adolescents or 1 in 10 adults)  ??? Chills,  sore joints (about 1 person in 10)  ??? Body aches (about 1 person in 3 or 4)   ??? Rash, swollen glands (uncommon)    Moderate Problems following Tdap  (Interfered with activities, but did not require medical attention)  ??? Pain where the shot was given (up to 1 in 5 or 6)   ??? Redness or swelling where the shot was given (up to about 1 in 16 adolescents or 1 in 12 adults)  ??? Fever over 102??F (about 1 in 100 adolescents or 1 in 250 adults)  ??? Headache (about 1 in 7 adolescents or 1 in 10 adults)  ??? Nausea, vomiting, diarrhea, stomach ache (up to 1 or 3 people in 100)  ??? Swelling of the entire arm where the shot was given (up to about 1 in 500).     Severe Problems following Tdap  (Unable to perform usual activities; required medical attention)  ??? Swelling, severe pain, bleeding, and redness in the arm where the shot was given (rare).    Problems that could happen after any vaccine:    ??? People sometimes faint after a medical procedure, including vaccination. Sitting or lying down for about 15 minutes can help prevent fainting, and injuries caused by a fall. Tell your doctor if you feel dizzy, or have vision changes or ringing in the ears.     ??? Some people get severe pain in the shoulder and have difficulty moving the arm where a shot was given. This happens very rarely.    ??? Any medication can cause a severe allergic reaction. Such reactions from a vaccine are very rare, estimated at fewer than 1 in a million doses, and would happen within a few minutes to a few hours after the vaccination.     As with any medicine, there is a very remote chance of a vaccine causing a serious injury or death.    The safety of vaccines is   always being monitored. For more information, visit: www.cdc.gov/vaccinesafety/    5. What if there is a serious problem?    What should I look for?  ??? Look for anything that concerns you, such as signs of a severe allergic reaction, very high fever, or unusual behavior.    ??? Signs of a severe allergic reaction can include hives, swelling of the face and throat, difficulty breathing, a fast heartbeat, dizziness, and weakness. These would usually start a few minutes to a few hours after the vaccination.    What should I do?  ??? If you think it is a severe allergic reaction or other emergency that can???t wait, call 9-1-1 or get the person to the nearest hospital. Otherwise, call your doctor.    ??? Afterward, the reaction should be reported to the Vaccine Adverse Event Reporting System (VAERS). Your doctor might file this report, or you can do it yourself through the VAERS web site at www.vaers.hhs.gov, or by calling 1-800-822-7967.    VAERS does not give medical advice.    6. The National Vaccine Injury Compensation Program    The National Vaccine Injury Compensation Program (VICP) is a federal program that was created to compensate people who may have been injured by certain vaccines.    Persons who believe they may have been injured by a vaccine can learn about the program and about filing a claim by calling 1-800-338-2382 or visiting the VICP website at www.hrsa.gov/vaccinecompensation. There is a  time limit to file a claim for compensation.     7. How can I learn more?    ??? Ask your doctor. He or she can give you the vaccine package insert or suggest other sources of information.  ??? Call your local or state health department.  ??? Contact the Centers for Disease Control and Prevention (CDC):  - Call 1-800-232-4636 (1-800-CDC-INFO) or  - Visit CDC???s website at www.cdc.gov/vaccines      Vaccine Information Statement   Tdap Vaccine  (11/06/2013)  42 [place] ?? [id number]    Department of Health and Human Services  Centers for Disease Control and Prevention    Office Use Only    Well Visit, Men 50 to 65: Care Instructions  Your Care Instructions  Physical exams can help you stay healthy. Your doctor has checked your overall health and may have suggested ways to take good care of yourself. He or she also may have recommended tests. At home, you can help prevent illness with healthy eating, regular exercise, and other steps.  Follow-up care is a key part of your treatment and safety. Be sure to make and go to all appointments, and call your doctor if you are having problems. It's also a good idea to know your test results and keep a list of the medicines you take.  How can you care for yourself at home?  ?? Reach and stay at a healthy weight. This will lower your risk for many problems, such as obesity, diabetes, heart disease, and high blood pressure.  ?? Get at least 30 minutes of exercise on most days of the week. Walking is a good choice. You also may want to do other activities, such as running, swimming, cycling, or playing tennis or team sports.  ?? Do not smoke. Smoking can make health problems worse. If you need help quitting, talk to your doctor about stop-smoking programs and medicines. These can increase your chances of quitting for good.  ?? Always wear sunscreen on exposed skin. Make   sure to use a broad-spectrum sunscreen that has a sun protection factor (SPF) of 30 or higher. Use it  every day, even when it is cloudy.  ?? See a dentist one or two times a year for checkups and to have your teeth cleaned.  ?? Wear a seat belt in the car.  ?? Limit alcohol to 2 drinks a day. Too much alcohol can cause health problems.  Follow your doctor's advice about when to have certain tests. These tests can spot problems early.  ?? Cholesterol. Your doctor will tell you how often to have this done based on your overall health and other things that can increase your risk for heart attack and stroke.  ?? Blood pressure. You will likely have your blood pressure checked at any visit to your doctor. If you are healthy and have a blood pressure below 120/80 mm Hg, have your blood pressure checked at least every 1 to 2 years. This can be done during a routine doctor visit. If you have slightly higher or high blood pressure, or are at risk for heart disease, your doctor will suggest more frequent tests.  ?? Prostate exam. Talk to your doctor about whether you should have a blood test (called a PSA test) for prostate cancer. Experts disagree on whether men should have this test. Some experts recommend that you discuss the benefits and risks of the test with your doctor.  ?? Diabetes. Ask your doctor whether you should have tests for diabetes.  ?? Vision. Some experts recommend that you have yearly exams for glaucoma and other age-related eye problems starting at age 50.  ?? Hearing. Tell your doctor if you notice any change in your hearing. You can have tests to find out how well you hear.  ?? Colon cancer. You should begin tests for colon cancer at age 50. You may have one of several tests. Your doctor will tell you how often to have tests based on your age and risk. Risks include whether you already had a precancerous polyp removed from your colon or whether your parent, brother, sister, or child has had colon cancer.  ?? Heart attack and stroke risk. At least every 4 to 6 years, you should  have your risk for heart attack and stroke assessed. Your doctor uses factors such as your age, blood pressure, cholesterol, and whether you smoke or have diabetes to show what your risk for a heart attack or stroke is over the next 10 years.  ?? Abdominal aortic aneurysm. Ask your doctor whether you should have a test to check for an aneurysm. You may need a test if you ever smoked or if your parent, brother, sister, or child has had an aneurysm.  When should you call for help?  Watch closely for changes in your health, and be sure to contact your doctor if you have any problems or symptoms that concern you.   Where can you learn more?   Go to http://www.healthwise.net/[facility]  Enter K916 in the search box to learn more about "Well Visit, Men 50 to 65: Care Instructions."   ?? 2006-2015 Health[employer]. Care instructions adapted under license by [facility] (which disclaims liability or warranty for this information). This care instruction is for use with your licensed healthcare professional. If you have questions about a medical condition or this instruction, always ask your healthcare professional. Healthwise, Incorporated disclaims any warranty or liability for your use of this information.  Content Version: 10.7.482551; Current as of: November 02, 2013

## 2014-11-15 NOTE — Progress Notes (Signed)
Chief Complaint   Patient presents with   ??? New Patient     Get established. Patient fasting.

## 2014-11-15 NOTE — Progress Notes (Signed)
HISTORY OF PRESENT ILLNESS  Kevin Davenport is a 59 y.o. male new pt to the practice here today because his wife made him come in. He drinks Gin and/or Vodka daily and has h/o recurring pancreatitis secondary to his alcohol use. He is currently not having any abdominal pain. His wife wants him to have a physical because he has recurring vomiting and she is worried that he may have colon cancer because his father was diagnosed w colon cancer and currently has colostomy. They are not describing hematemesis or coffee ground emesis. He has multiple ED visits for abdominal pain and vomiting. He is not motivated to stop drinking at this time and without resources in place to address withdrawal it would not be safe for him to abruptly stop at this time.  New Patient  The history is provided by the patient and spouse. Pertinent negatives include no chest pain, no abdominal pain and no shortness of breath.       Review of Systems   Constitutional: Negative for malaise/fatigue.   Respiratory: Negative for shortness of breath.    Cardiovascular: Negative for chest pain.   Gastrointestinal: Positive for vomiting. Negative for abdominal pain and blood in stool.       Physical Exam   Constitutional: He is oriented to person, place, and time. He appears well-developed and well-nourished.   BP 140/92 mmHg   Pulse 85   Temp(Src) 97.6 ??F (36.4 ??C) (Oral)   Resp 18   Ht  (1.753 m)   Wt 188 lb 9.6 oz (85.548 kg)   BMI 27.84 kg/m2   SpO2 99%  Ambulates independently   HENT:   Head: Normocephalic and atraumatic.   Eyes: No scleral icterus.   Neck: No thyromegaly present.   Cardiovascular: Normal heart sounds.    Pulmonary/Chest: Breath sounds normal.   Abdominal: Soft. He exhibits no distension. There is no tenderness. There is no rebound and no guarding.   Musculoskeletal: He exhibits no edema.   Lymphadenopathy:     He has no cervical adenopathy.   Neurological: He is alert and oriented to person, place, and time.    Grossly non-focal   Skin: Skin is warm and dry.   Psychiatric: He has a normal mood and affect.   Nursing note and vitals reviewed.    Patient Active Problem List   Diagnosis Code   ??? Alcohol abuse, continuous F10.10   ??? Acute pancreatitis K85.9   ??? Tobacco abuse Z72.0     Past Medical History   Diagnosis Date   ??? Other ill-defined conditions(799.89)      Pancreatitis   ??? Pancreatitis      History     Social History   ??? Marital Status: MARRIED     Spouse Name: N/A   ??? Number of Children: N/A   ??? Years of Education: N/A     Social History Main Topics   ??? Smoking status: Current Every Day Smoker -- 0.50 packs/day   ??? Smokeless tobacco: Not on file      Comment: 1/2 ppd x 18 years   ??? Alcohol Use: Yes      Comment: Drinks an huge amount everyday unsure of ounces.   ??? Drug Use: No   ??? Sexual Activity: Not on file     Other Topics Concern   ??? None     Social History Narrative     Family History   Problem Relation Age of Onset   ??? Hypertension Mother    ???  Hypertension Father      No current outpatient prescriptions on file.     No Known Allergies      ASSESSMENT and PLAN  Daily ETOH use w known h/o pancreatitis, asymptomatic today. Refer to GI for further evaluation.He does not wish to stop drinking at this time. BP was borderline, will recheck at his next visit, EKG nsr 81bpm no acute finding. Care plan reviewed and pt understands.  After visit summary printed and reviewed with patient.    [name] was seen today for new patient.    Diagnoses and all orders for this visit:    Well adult health check  Orders:  -     Cancel: AMB POC URINALYSIS DIP STICK AUTO W/O MICRO  -     CBC WITH AUTOMATED DIFF  -     METABOLIC PANEL, COMPREHENSIVE  -     LIPID PANEL  -     TSH, 3RD GENERATION  -     HEPATITIS C AB  -     PROSTATE SPECIFIC AG  -     AMB POC EKG ROUTINE W/ 12 LEADS, INTER & REP  -     AMB POC URINALYSIS DIP STICK AUTO W/O MICRO    Alcohol abuse, continuous  Orders:  -     REFERRAL TO GASTROENTEROLOGY     Alcohol-induced acute pancreatitis  Orders:  -     REFERRAL TO GASTROENTEROLOGY  -     AMYLASE  -     LIPASE    Tobacco abuse    Encounter for immunization  Orders:  -     Tetanus, diphtheria toxoids and acellular pertussis (TDAP) vaccine, in individuals >=7 years, IM    Establishing care with new doctor, encounter for      Follow-up Disposition:  Return in about 1 month (around 12/16/2014) for recheck BP.

## 2014-11-16 LAB — HEPATITIS C ANTIBODY: HCV Ab: <0.1 s/co ratio (ref 0.0–0.9)

## 2014-11-16 LAB — CBC WITH AUTOMATED DIFF
ABS. BASOPHILS: 0 10*3/uL (ref 0.0–0.2)
ABS. EOSINOPHILS: 0 10*3/uL (ref 0.0–0.4)
ABS. IMM. GRANS.: 0 10*3/uL (ref 0.0–0.1)
ABS. MONOCYTES: 0.6 10*3/uL (ref 0.1–0.9)
ABS. NEUTROPHILS: 4.6 10*3/uL (ref 1.4–7.0)
Abs Lymphocytes: 1.6 10*3/uL (ref 0.7–3.1)
BASOPHILS: 0 %
EOSINOPHILS: 1 %
HCT: 43.8 % (ref 37.5–51.0)
HGB: 15.3 g/dL (ref 12.6–17.7)
IMMATURE GRANULOCYTES: 0 %
Lymphocytes: 23 %
MCH: 32.5 pg (ref 26.6–33.0)
MCHC: 34.9 g/dL (ref 31.5–35.7)
MCV: 93 fL (ref 79–97)
MONOCYTES: 9 %
NEUTROPHILS: 67 %
PLATELET: 188 10*3/uL (ref 150–379)
RBC: 4.71 x10E6/uL (ref 4.14–5.80)
RDW: 15.1 % (ref 12.3–15.4)
WBC: 6.9 10*3/uL (ref 3.4–10.8)

## 2014-11-16 LAB — METABOLIC PANEL, COMPREHENSIVE
A-G Ratio: 1.4 (ref 1.1–2.5)
ALT (SGPT): 29 IU/L (ref 0–44)
AST (SGOT): 36 IU/L (ref 0–40)
Albumin: 4.2 g/dL (ref 3.6–4.8)
Alk. phosphatase: 78 IU/L (ref 39–117)
BUN/Creatinine ratio: 11 (ref 10–22)
BUN: 9 mg/dL (ref 8–27)
Bilirubin, total: 0.6 mg/dL (ref 0.0–1.2)
CO2: 25 mmol/L (ref 18–29)
Calcium: 9.2 mg/dL (ref 8.6–10.2)
Chloride: 101 mmol/L (ref 97–108)
Creatinine: 0.8 mg/dL (ref 0.76–1.27)
GFR est AA: 112 mL/min/{1.73_m2} (ref >59)
GFR est non-AA: 97 mL/min/{1.73_m2} (ref >59)
GLOBULIN, TOTAL: 2.9 g/dL (ref 1.5–4.5)
Glucose: 104 mg/dL — ABNORMAL HIGH (ref 65–99)
Potassium: 4.6 mmol/L (ref 3.5–5.2)
Protein, total: 7.1 g/dL (ref 6.0–8.5)
Sodium: 143 mmol/L (ref 134–144)

## 2014-11-16 LAB — AMYLASE: Amylase: 120 U/L (ref 31–124)

## 2014-11-16 LAB — LIPID PANEL
Cholesterol, total: 240 mg/dL — ABNORMAL HIGH (ref 100–199)
HDL Cholesterol: 78 mg/dL (ref >39)
LDL, calculated: 149 mg/dL — ABNORMAL HIGH (ref 0–99)
Triglyceride: 66 mg/dL (ref 0–149)
VLDL, calculated: 13 mg/dL (ref 5–40)

## 2014-11-16 LAB — HEPATITIS C AB: Hep C Virus Ab: <0.1 s/co ratio (ref 0.0–0.9)

## 2014-11-16 LAB — PSA, DIAGNOSTIC (PROSTATE SPECIFIC AG): Prostate Specific Ag: 4.4 ng/mL — ABNORMAL HIGH (ref 0.0–4.0)

## 2014-11-16 LAB — LIPASE: Lipase: 58 U/L (ref 0–59)

## 2014-11-16 LAB — CVD REPORT

## 2014-11-16 LAB — TSH 3RD GENERATION: TSH: 1.31 u[IU]/mL (ref 0.450–4.500)

## 2014-11-18 ENCOUNTER — Telehealth

## 2014-11-18 NOTE — Progress Notes (Signed)
Quick Note:        Call pt, your prostate blood test was very slightly elevated. Recommend referral to Urology for evaluation. All of your other labs look good. Cindy - referral to Urology for elevated PSA if pt agrees.    ______

## 2014-11-18 NOTE — Addendum Note (Signed)
Addended by: Nehemiah MassedFUSON, DREAMA on: 11/18/2014 09:46 AM      Modules accepted: Level of Service

## 2014-11-18 NOTE — Progress Notes (Signed)
Quick Note:        Patient notified of results & referral ordered    ______

## 2014-11-18 NOTE — Telephone Encounter (Signed)
Order placed for referral to Dr. Kristine Garbeote, Urology, per Verbal Order from Dr. Drucilla SchmidtHeadley on 11/18/2014 due to elevated PSA.

## 2014-11-28 ENCOUNTER — Encounter

## 2014-12-09 ENCOUNTER — Other Ambulatory Visit: Payer: Self-pay | Admitting: Dermatology

## 2014-12-09 ENCOUNTER — Ambulatory Visit: Payer: PRIVATE HEALTH INSURANCE | Primary: Family Medicine

## 2014-12-16 ENCOUNTER — Ambulatory Visit
Admit: 2014-12-16 | Discharge: 2014-12-16 | Payer: PRIVATE HEALTH INSURANCE | Attending: Internal Medicine | Primary: Family Medicine

## 2014-12-16 ENCOUNTER — Encounter: Attending: Internal Medicine | Primary: Family Medicine

## 2014-12-16 DIAGNOSIS — IMO0001 Reserved for inherently not codable concepts without codable children: Secondary | ICD-10-CM

## 2014-12-16 MED ORDER — CLONIDINE 0.1 MG TAB
0.1 mg | ORAL_TABLET | ORAL | Status: DC
Start: 2014-12-16 — End: 2015-04-11

## 2014-12-16 NOTE — Progress Notes (Signed)
Chief Complaint   Patient presents with   ??? Hypertension     1 mo check. Had colonoscopy today

## 2014-12-16 NOTE — Progress Notes (Signed)
HISTORY OF PRESENT ILLNESS  Kevin Davenport is a 59 y.o. male here to check BP. He has followed up with Urology and GI, has prostate bx next week, had colonoscopy today, has MRCP later this week. BP was elevated today for colonoscopy. Pt has not had ETOH yesterday or today. He seems to sincerely want to stop, his wife is skeptical. He has been to substance abuse previously through [facility], has attended AA previously.   Hypertension   The history is provided by the patient and spouse. This is a new problem. Pertinent negatives include no chest pain, no dizziness and no shortness of breath.       Review of Systems   Respiratory: Negative for shortness of breath.    Cardiovascular: Negative for chest pain.   Neurological: Negative for dizziness.       Physical Exam   Constitutional: He is oriented to person, place, and time. He appears well-developed and well-nourished.   BP 150/106 mmHg   Pulse 91   Temp(Src) 98 ??F (36.7 ??C) (Oral)   Resp 18   Ht  (1.753 m)   Wt 185 lb 12.8 oz (84.278 kg)   BMI 27.43 kg/m2   SpO2 99%     HENT:   Head: Normocephalic and atraumatic.   Eyes: No scleral icterus.   Cardiovascular: Normal heart sounds.    Pulmonary/Chest: Breath sounds normal.   Neurological: He is alert and oriented to person, place, and time.   Psychiatric:   Affect seems sad    Nursing note and vitals reviewed.    Patient Active Problem List   Diagnosis Code   ??? Alcohol abuse, continuous F10.10   ??? Acute pancreatitis K85.9   ??? Tobacco abuse Z72.0     Past Medical History   Diagnosis Date   ??? Other ill-defined conditions(799.89)      Pancreatitis   ??? Pancreatitis      History     Social History   ??? Marital Status: MARRIED     Spouse Name: N/A   ??? Number of Children: N/A   ??? Years of Education: N/A     Social History Main Topics   ??? Smoking status: Current Every Day Smoker -- 0.50 packs/day   ??? Smokeless tobacco: Not on file      Comment: 1/2 ppd x 18 years   ??? Alcohol Use: Yes       Comment: Drinks an huge amount everyday unsure of ounces.   ??? Drug Use: No   ??? Sexual Activity: Not on file     Other Topics Concern   ??? None     Social History Narrative     Family History   Problem Relation Age of Onset   ??? Hypertension Mother    ??? Hypertension Father      Current Outpatient Prescriptions   Medication Sig   ??? cloNIDine HCl (CATAPRES) 0.1 mg tablet 1 po qhs     No Known Allergies      ASSESSMENT and PLAN  BP up today, pt has been fasting for colonoscopy which he had earlier today, he has also not had ETOH for close to 48hrs. Although he does not present in DTs today, I presume he had medication for conscious sedation during his procedure today. We talked about treatment program options to stop drinking in a safe manner. It would not be appropriate for me to treat him for prevention of withdrawal without having professional, trained support in place. After much  discussion with pt and his wife we are going to try Clonidine for his BP and titrate the dosing up if tolerated. If it makes him drowsy we will switch to another medication. Care plan reviewed and pt understands.  After visit summary printed and reviewed with patient.    [name] was seen today for hypertension.    Diagnoses and all orders for this visit:    Elevated blood pressure  Orders:  -     cloNIDine HCl (CATAPRES) 0.1 mg tablet; 1 po qhs    Alcohol abuse, continuous      Follow-up Disposition: Not on File  reviewed medications and side effects in detail

## 2014-12-19 ENCOUNTER — Inpatient Hospital Stay: Admit: 2014-12-19 | Payer: PRIVATE HEALTH INSURANCE | Attending: Specialist | Primary: Family Medicine

## 2014-12-19 DIAGNOSIS — K852 Alcohol induced acute pancreatitis: Secondary | ICD-10-CM

## 2014-12-19 MED ORDER — GADOBUTROL 10 MMOL/10 ML (1 MMOL/ML) IV
10 mmol/ mL (1 mmol/mL) | Freq: Once | INTRAVENOUS | Status: AC
Start: 2014-12-19 — End: 2014-12-19
  Administered 2014-12-19: 16:00:00 via INTRAVENOUS

## 2015-01-16 ENCOUNTER — Encounter: Attending: Internal Medicine | Primary: Family Medicine

## 2015-04-11 MED ORDER — CLONIDINE 0.1 MG TAB
0.1 mg | ORAL_TABLET | ORAL | Status: DC
Start: 2015-04-11 — End: 2015-05-09

## 2015-05-12 MED ORDER — CLONIDINE 0.1 MG TAB
0.1 mg | ORAL_TABLET | ORAL | 0 refills | Status: DC
Start: 2015-05-12 — End: 2015-06-02

## 2015-06-02 ENCOUNTER — Encounter: Admit: 2015-06-02 | Primary: Family Medicine

## 2015-06-02 ENCOUNTER — Ambulatory Visit
Admit: 2015-06-02 | Discharge: 2015-06-02 | Payer: PRIVATE HEALTH INSURANCE | Attending: Internal Medicine | Primary: Family Medicine

## 2015-06-02 DIAGNOSIS — I10 Essential (primary) hypertension: Secondary | ICD-10-CM

## 2015-06-02 DIAGNOSIS — M25562 Pain in left knee: Secondary | ICD-10-CM

## 2015-06-02 MED ORDER — CLONIDINE 0.1 MG TAB
0.1 mg | ORAL_TABLET | ORAL | 1 refills | Status: DC
Start: 2015-06-02 — End: 2015-08-25

## 2015-06-02 NOTE — Progress Notes (Signed)
HISTORY OF PRESENT ILLNESS  Kevin Davenport is a 59 y.o. male here for follow up of HTN, wife accompanies pt and provides all of his hx. He has stopped drinking for about now, with a "slip up here and there". He has not had any GI problems since I saw him last. He was supposed to have prostate bx with urology but didn't go for follow up and declines further eval of his prostate. He is having bilateral knee pain for quite a while now, no injury. His wife takes Percocet for her RA and if he takes her Percocet his knees feel better.  Hypertension    The history is provided by the patient. This is a chronic problem. The current episode started more than 1 week ago. The problem has not changed since onset.Pertinent negatives include no chest pain, no dizziness and no shortness of breath.   Knee Pain    The history is provided by the patient. This is a chronic problem. The current episode started more than 1 week ago. The problem occurs every several days.       Review of Systems   Respiratory: Negative for shortness of breath.    Cardiovascular: Negative for chest pain.   Neurological: Negative for dizziness.       Physical Exam   Constitutional: He appears well-developed and well-nourished.   BP (!) 176/98 (BP 1 Location: Left arm, BP Patient Position: Sitting)  Pulse 79  Temp 98.7 ??F (37.1 ??C) (Oral)   Resp 18  Ht  (1.753 m)  Wt 186 lb 6.4 oz (84.6 kg)  SpO2 99%  BMI 27.53 kg/m2     HENT:   Head: Normocephalic and atraumatic.   Eyes: No scleral icterus.   Neck: No thyromegaly present.   Cardiovascular: Normal heart sounds.    Pulmonary/Chest: Breath sounds normal.   Abdominal: Soft. There is no tenderness.   Musculoskeletal: He exhibits no edema.   Bilateral knees w FROM, no joint effusion   Lymphadenopathy:     He has no cervical adenopathy.   Nursing note and vitals reviewed.    Patient Active Problem List   Diagnosis Code   ??? Alcohol abuse, continuous F10.10   ??? Acute pancreatitis K85.9    ??? Tobacco abuse Z72.0   ??? History of colonoscopy Z98.89     Past Medical History   Diagnosis Date   ??? Other ill-defined conditions(799.89)      Pancreatitis   ??? Pancreatitis      Social History     Social History   ??? Marital status: MARRIED     Spouse name: N/A   ??? Number of children: N/A   ??? Years of education: N/A     Social History Main Topics   ??? Smoking status: Current Every Day Smoker     Packs/day: 0.50   ??? Smokeless tobacco: None      Comment: 1/2 ppd x 18 years   ??? Alcohol use Yes      Comment: Drinks an huge amount everyday unsure of ounces.   ??? Drug use: No   ??? Sexual activity: Not Asked     Other Topics Concern   ??? None     Social History Narrative     Family History   Problem Relation Age of Onset   ??? Hypertension Mother    ??? Hypertension Father      Current Outpatient Prescriptions   Medication Sig   ??? cloNIDine HCl (CATAPRES) 0.1 mg  tablet 2 po bid     No Known Allergies    ASSESSMENT and PLAN  Elevated BP, will titrate Clonidine up and I gave him a calendar of how to do that over the next 3 weeks and then he will see me back for recheck. Labs are pending and xrays of knees are pending. Care plan reviewed and pt understands.  After visit summary printed and reviewed with patient.    [name] was seen today for hypertension and knee pain.    Diagnoses and all orders for this visit:    Essential hypertension with goal blood pressure less than 140/90  -     cloNIDine HCl (CATAPRES) 0.1 mg tablet; 2 po bid  -     CBC WITH AUTOMATED DIFF  -     METABOLIC PANEL, COMPREHENSIVE    Pain in both knees, unspecified chronicity  -     XR KNEE RT MAX 2 VWS; Future  -     XR KNEE LT MAX 2 VWS; Future    History of pancreatitis  -     AMYLASE  -     LIPASE      Follow-up Disposition:  Return in about 1 month (around 07/02/2015) for recheck BP.

## 2015-06-02 NOTE — Progress Notes (Signed)
Chief Complaint   Patient presents with   ??? Hypertension   ??? Knee Pain     Check meds. Both knees hurting

## 2015-06-03 LAB — METABOLIC PANEL, COMPREHENSIVE
A-G Ratio: 1.4 (ref 1.1–2.5)
ALT (SGPT): 16 IU/L (ref 0–44)
AST (SGOT): 25 IU/L (ref 0–40)
Albumin: 4.1 g/dL (ref 3.6–4.8)
Alk. phosphatase: 74 IU/L (ref 39–117)
BUN/Creatinine ratio: 13 (ref 10–22)
BUN: 11 mg/dL (ref 8–27)
Bilirubin, total: 0.4 mg/dL (ref 0.0–1.2)
CO2: 26 mmol/L (ref 18–29)
Calcium: 9.2 mg/dL (ref 8.6–10.2)
Chloride: 102 mmol/L (ref 97–108)
Creatinine: 0.84 mg/dL (ref 0.76–1.27)
GFR est AA: 109 mL/min/{1.73_m2} (ref >59)
GFR est non-AA: 95 mL/min/{1.73_m2} (ref >59)
GLOBULIN, TOTAL: 3 g/dL (ref 1.5–4.5)
Glucose: 85 mg/dL (ref 65–99)
Potassium: 4.5 mmol/L (ref 3.5–5.2)
Protein, total: 7.1 g/dL (ref 6.0–8.5)
Sodium: 144 mmol/L (ref 134–144)

## 2015-06-03 LAB — CBC WITH AUTOMATED DIFF
ABS. BASOPHILS: 0 10*3/uL (ref 0.0–0.2)
ABS. EOSINOPHILS: 0 10*3/uL (ref 0.0–0.4)
ABS. IMM. GRANS.: 0 10*3/uL (ref 0.0–0.1)
ABS. MONOCYTES: 0.5 10*3/uL (ref 0.1–0.9)
ABS. NEUTROPHILS: 5.4 10*3/uL (ref 1.4–7.0)
Abs Lymphocytes: 1.4 10*3/uL (ref 0.7–3.1)
BASOPHILS: 0 %
EOSINOPHILS: 0 %
HCT: 42 % (ref 37.5–51.0)
HGB: 14.1 g/dL (ref 12.6–17.7)
IMMATURE GRANULOCYTES: 0 %
Lymphocytes: 19 %
MCH: 31.3 pg (ref 26.6–33.0)
MCHC: 33.6 g/dL (ref 31.5–35.7)
MCV: 93 fL (ref 79–97)
MONOCYTES: 7 %
NEUTROPHILS: 74 %
PLATELET: 222 10*3/uL (ref 150–379)
RBC: 4.51 x10E6/uL (ref 4.14–5.80)
RDW: 14.7 % (ref 12.3–15.4)
WBC: 7.4 10*3/uL (ref 3.4–10.8)

## 2015-06-03 LAB — LIPASE: Lipase: 29 U/L (ref 0–59)

## 2015-06-03 LAB — AMYLASE: Amylase: 81 U/L (ref 31–124)

## 2015-08-25 ENCOUNTER — Encounter

## 2015-08-26 MED ORDER — CLONIDINE 0.1 MG TAB
0.1 mg | ORAL_TABLET | ORAL | 0 refills | Status: DC
Start: 2015-08-26 — End: 2015-09-27

## 2015-08-29 ENCOUNTER — Encounter: Attending: Internal Medicine | Primary: Family Medicine

## 2015-09-27 ENCOUNTER — Encounter

## 2015-09-28 MED ORDER — CLONIDINE 0.1 MG TAB
0.1 mg | ORAL_TABLET | ORAL | 0 refills | Status: DC
Start: 2015-09-28 — End: 2015-12-23

## 2015-12-08 ENCOUNTER — Encounter

## 2015-12-22 ENCOUNTER — Other Ambulatory Visit: Payer: Self-pay | Admitting: Dermatology

## 2015-12-23 ENCOUNTER — Encounter

## 2015-12-23 MED ORDER — CLONIDINE 0.1 MG TAB
0.1 mg | ORAL_TABLET | ORAL | 0 refills | Status: DC
Start: 2015-12-23 — End: 2016-02-10

## 2016-01-19 ENCOUNTER — Encounter: Attending: Internal Medicine | Primary: Family Medicine

## 2016-01-20 ENCOUNTER — Encounter: Attending: Internal Medicine | Primary: Family Medicine

## 2016-02-10 ENCOUNTER — Encounter

## 2016-02-23 MED ORDER — CLONIDINE 0.1 MG TAB
0.1 mg | ORAL_TABLET | ORAL | 0 refills | Status: DC
Start: 2016-02-23 — End: 2016-03-12

## 2016-03-12 ENCOUNTER — Ambulatory Visit
Admit: 2016-03-12 | Discharge: 2016-03-12 | Payer: PRIVATE HEALTH INSURANCE | Attending: Family | Primary: Family Medicine

## 2016-03-12 DIAGNOSIS — G8929 Other chronic pain: Secondary | ICD-10-CM

## 2016-03-12 MED ORDER — TRAMADOL 50 MG TAB
50 mg | ORAL_TABLET | Freq: Four times a day (QID) | ORAL | 0 refills | Status: DC | PRN
Start: 2016-03-12 — End: 2017-06-29

## 2016-03-12 MED ORDER — PREDNISONE 10 MG TABLETS IN A DOSE PACK
10 mg | ORAL_TABLET | ORAL | 0 refills | Status: DC
Start: 2016-03-12 — End: 2017-06-29

## 2016-03-12 MED ORDER — CLONIDINE 0.1 MG TAB
0.1 mg | ORAL_TABLET | ORAL | 0 refills | Status: DC
Start: 2016-03-12 — End: 2016-04-19

## 2016-03-12 MED ORDER — DICLOFENAC 75 MG TAB, DELAYED RELEASE
75 mg | ORAL_TABLET | Freq: Two times a day (BID) | ORAL | 1 refills | Status: DC | PRN
Start: 2016-03-12 — End: 2016-05-03

## 2016-03-12 NOTE — Progress Notes (Signed)
HISTORY OF PRESENT ILLNESS  Kevin Davenport is a 60 y.o. male.  HPI Patient comes in today for knee pain.  Patient was previously seeing Dr. Ovid Curd.  Will be transitioning care over to me.  Complains of cracking and swelling on kneecaps.  Has been going on almost 1 year.  No injury past or present.  Hx of RA in family.  Taking Advil with minimal relief.  Wife states she has given patient a hydrocodone (one of her pills) for patient when pain severe.  Has not done any PT.  Is wearing knee brace on left knee.  No hx of gout.  Does eat red meats, seafood, drinks beer.  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  No Known Allergies    Past Medical History:   Diagnosis Date   ??? Arthritis    ??? Other ill-defined conditions     Pancreatitis   ??? Pancreatitis        Past Surgical History:   Procedure Laterality Date   ??? HX COLONOSCOPY         Social History     Social History   ??? Marital status: MARRIED     Spouse name: N/A   ??? Number of children: N/A   ??? Years of education: N/A     Occupational History   ??? Not on file.     Social History Main Topics   ??? Smoking status: Current Every Day Smoker     Packs/day: 0.50   ??? Smokeless tobacco: Current User      Comment: 1/2 ppd x 18 years   ??? Alcohol use Yes      Comment: Drinks an huge amount everyday unsure of ounces.   ??? Drug use: No   ??? Sexual activity: Yes     Partners: Female     Other Topics Concern   ??? Not on file     Social History Narrative       Family History   Problem Relation Age of Onset   ??? Hypertension Mother    ??? Diabetes Mother    ??? Alzheimer Mother    ??? Hypertension Father        Current Outpatient Prescriptions   Medication Sig   ??? cloNIDine HCl (CATAPRES) 0.1 mg tablet TAKE 2 TABLETS BY MOUTH TWICE DAILY     No current facility-administered medications for this visit.      Review of Systems   Constitutional: Negative for chills and fever.   Musculoskeletal: Positive for joint pain (bilateral knee pain). Negative for falls.    Skin: Negative for itching and rash.   Neurological: Negative for tingling, sensory change and focal weakness.     Vitals:    03/12/16 1551   BP: (!) 144/99   Pulse: 68   Resp: 20   Temp: 96.7 ??F (35.9 ??C)   TempSrc: Oral   SpO2: 100%   Weight: 184 lb (83.5 kg)   Height: 5' 9"  (1.753 m)     Physical Exam   Constitutional: He is oriented to person, place, and time. He appears well-developed and well-nourished.   Cardiovascular: Normal rate and regular rhythm.    Pulses:       Dorsalis pedis pulses are 2+ on the right side, and 2+ on the left side.   Pulmonary/Chest: Effort normal.   Musculoskeletal:        Right knee: He exhibits swelling. He exhibits normal range of motion, no effusion, no ecchymosis, no deformity, no laceration,  no erythema, normal alignment, no LCL laxity, normal patellar mobility, no bony tenderness and no MCL laxity. Tenderness found. Lateral joint line tenderness noted. No medial joint line, no MCL, no LCL and no patellar tendon tenderness noted.        Left knee: He exhibits swelling, LCL laxity, bony tenderness and MCL laxity. He exhibits normal range of motion, no effusion, no ecchymosis, no deformity, no laceration, no erythema, normal alignment and normal patellar mobility. Tenderness found. Medial joint line and patellar tendon tenderness noted. No MCL and no LCL tenderness noted.   Neurological: He is alert and oriented to person, place, and time.   Skin: Skin is warm and dry.       ASSESSMENT and PLAN    ICD-10-CM ICD-9-CM    1. Chronic pain of both knees M25.561 719.46 RHEUMATOID FACTOR, QL    M25.562 338.29 ANA W/REFLEX    G89.29  SED RATE (ESR)      METABOLIC PANEL, BASIC      URIC ACID      predniSONE (STERAPRED DS) 10 mg dose pack      traMADol (ULTRAM) 50 mg tablet      diclofenac EC (VOLTAREN) 75 mg EC tablet      REFERRAL TO ORTHOPEDIC SURGERY   2. Essential hypertension with goal blood pressure less than 140/90 I10 401.9 cloNIDine HCl (CATAPRES) 0.1 mg tablet      Encounter Diagnoses   Name Primary?   ??? Chronic pain of both knees Yes   ??? Essential hypertension with goal blood pressure less than 140/90      Orders Placed This Encounter   ??? RHEUMATOID FACTOR, QL   ??? ANA W/REFLEX   ??? SED RATE (ESR)   ??? METABOLIC PANEL, BASIC   ??? URIC ACID   ??? REFERRAL TO ORTHOPEDIC SURGERY   ??? cloNIDine HCl (CATAPRES) 0.1 mg tablet   ??? predniSONE (STERAPRED DS) 10 mg dose pack   ??? traMADol (ULTRAM) 50 mg tablet   ??? diclofenac EC (VOLTAREN) 75 mg EC tablet     Kevin Davenport was seen today for establish care, knee pain and medication refill.    Diagnoses and all orders for this visit:    Chronic pain of both knees - xray of both knees revealed: Two views of the left knee demonstrate slight degenerative change and demonstrate calcification at the femoral insertion of the medial collateral ligament suggesting old posttraumatic change.  There is no fracture.  Vascular calcification is noted.  Two views of the right knee demonstrate degenerative change with  spurring.  There is no fracture.  Vascular calcification is noted.  Will provide with steroid taper, NSAID to start after taper, tramadol for pain, check labs, refer to ortho.  -     RHEUMATOID FACTOR, QL  -     ANA W/REFLEX  -     SED RATE (ESR)  -     METABOLIC PANEL, BASIC  -     URIC ACID  -     predniSONE (STERAPRED DS) 10 mg dose pack; See administration instruction per 59m dose pack  -     traMADol (ULTRAM) 50 mg tablet; Take 1 Tab by mouth every six (6) hours as needed for Pain. Max Daily Amount: 200 mg.  -     diclofenac EC (VOLTAREN) 75 mg EC tablet; Take 1 Tab by mouth two (2) times daily as needed. For knee pain  -      REFERRAL TO ORTHO  Essential hypertension with goal blood pressure less than 140/90  -     cloNIDine HCl (CATAPRES) 0.1 mg tablet; TAKE 2 TABLETS BY MOUTH TWICE DAILY      Follow-up Disposition:  Return if symptoms worsen or fail to improve.    I have reviewed the patient's allergies and made any necessary changes.  Medical, procedural, social and family histories have been reviewed and updated as medically indicated. I have reconciled and/or revised patient medications in the EMR.       I have discussed each diagnosis listed in this note with Kevin Davenport and/or their family. I have discussed treatment options and the risk/benefit analysis of those options, including safe use of medications and possible medication side effects.  Through the use of shared decision making we have agreed to the above plan.      The patient has received an after-visit summary and questions were answered concerning future plans.     Kevin Marschner C. Serapio Edelson, FNP-C    This note will not be viewable in MyChart.

## 2016-03-12 NOTE — Patient Instructions (Addendum)
Knee Arthritis: Care Instructions  Your Care Instructions  Knee arthritis is a breakdown of the cartilage that cushions your knee joint. When the cartilage wears down, your bones rub against each other. This causes pain and stiffness. Knee arthritis tends to get worse with time.  Treatment for knee arthritis involves reducing pain, making the leg muscles stronger, and staying at a healthy body weight. The treatment usually does not improve the health of the cartilage, but it can reduce pain and improve how well your knee works.  You can take simple measures to protect your knee joints, ease your pain, and help you stay active.  Follow-up care is a key part of your treatment and safety. Be sure to make and go to all appointments, and call your doctor if you are having problems. It's also a good idea to know your test results and keep a list of the medicines you take.  How can you care for yourself at home?  ?? Know that knee arthritis will cause more pain on some days than on others.  ?? Stay at a healthy weight. Lose weight if you are overweight. When you stand up, the pressure on your knees from every pound of body weight is multiplied four times. So if you lose 10 pounds, you will reduce the pressure on your knees by 40 pounds.  ?? Talk to your doctor or physical therapist about exercises that will help ease joint pain.  ?? Stretch to help prevent stiffness and to prevent injury before you exercise. You may enjoy gentle forms of yoga to help keep your knee joints and muscles flexible.  ?? Walk instead of jog.  ?? Ride a bike. This makes your thigh muscles stronger and takes pressure off your knee.  ?? Wear well-fitting and comfortable shoes.  ?? Exercise in chest-deep water. This can help you exercise longer with less pain.  ?? Avoid exercises that include squatting or kneeling. They can put a lot of strain on your knees.  ?? Talk to your doctor to make sure that the exercise you do is not making the arthritis worse.   ?? Do not sit for long periods of time. Try to walk once in a while to keep your knee from getting stiff.  ?? Ask your doctor or physical therapist whether shoe inserts may reduce your arthritis pain.  ?? If you can afford it, get new athletic shoes at least every year. This can help reduce the strain on your knees.  ?? Use a device to help you do everyday activities.  ?? A cane or walking stick can help you keep your balance when you walk. Hold the cane or walking stick in the hand opposite the painful knee.  ?? If you feel like you may fall when you walk, try using crutches or a front-wheeled walker. These can prevent falls that could cause more damage to your knee.  ?? A knee brace may help keep your knee stable and prevent pain.  ?? You also can use other things to make life easier, such as a higher toilet seat and handrails in the bathtub or shower.  ?? Take pain medicines exactly as directed.  ?? Do not wait until you are in severe pain. You will get better results if you take it sooner.  ?? If you are not taking a prescription pain medicine, take an over-the-counter medicine such as acetaminophen (Tylenol), ibuprofen (Advil, Motrin), or naproxen (Aleve). Read and follow all instructions on the label.  ??   Do not take two or more pain medicines at the same time unless the doctor told you to. Many pain medicines have acetaminophen, which is Tylenol. Too much acetaminophen (Tylenol) can be harmful.  ?? Tell your doctor if you take a blood thinner, have diabetes, or have allergies to shellfish.  ?? Ask your doctor if you might benefit from a shot of steroid medicine into your knee. This may provide pain relief for several months.  ?? Many people take the supplements glucosamine and chondroitin for osteoarthritis. Some people feel they help, but the medical research does not show that they work. Talk to your doctor before you take these supplements.  When should you call for help?   Call your doctor now or seek immediate medical care if:  ?? You have sudden swelling, warmth, or pain in your knee.  ?? You have knee pain and a fever or rash.  ?? You have such bad pain that you cannot use your knee.  Watch closely for changes in your health, and be sure to contact your doctor if you have any problems.  Where can you learn more?  Go to http://www.healthwise.net/GoodHelpConnections.  Enter W187 in the search box to learn more about "Knee Arthritis: Care Instructions."  Current as of: July 14, 2015  Content Version: 11.3  ?? 2006-2017 [employer]. Care instructions adapted under license by [employer] (which disclaims liability or warranty for this information). If you have questions about a medical condition or this instruction, always ask your healthcare professional. Healthwise, Incorporated disclaims any warranty or liability for your use of this information.       Knee Arthritis: Exercises  Your Care Instructions  Here are some examples of exercises for knee arthritis. Start each exercise slowly. Ease off the exercise if you start to have pain.  Your doctor or physical therapist will tell you when you can start these exercises and which ones will work best for you.  How to do the exercises  Knee flexion with heel slide    1. Lie on your back with your knees bent.  2. Slide your heel back by bending your affected knee as far as you can. Then hook your other foot around your ankle to help pull your heel even farther back.  3. Hold for about 6 seconds, then rest for up to 10 seconds.  4. Repeat 8 to 12 times.  5. Switch legs and repeat steps 1 through 4, even if only one knee is sore.  Quad sets    1. Sit with your affected leg straight and supported on the floor or a firm bed. Place a small, rolled-up towel under your knee. Your other leg should be bent, with that foot flat on the floor.  2. Tighten the thigh muscles of your affected leg by pressing the back of  your knee down into the towel.  3. Hold for about 6 seconds, then rest for up to 10 seconds.  4. Repeat 8 to 12 times.  5. Switch legs and repeat steps 1 through 4, even if only one knee is sore.  Straight-leg raises to the front    1. Lie on your back with your good knee bent so that your foot rests flat on the floor. Your affected leg should be straight. Make sure that your low back has a normal curve. You should be able to slip your hand in between the floor and the small of your back, with your palm touching the floor   and your back touching the back of your hand.  2. Tighten the thigh muscles in your affected leg by pressing the back of your knee flat down to the floor. Hold your knee straight.  3. Keeping the thigh muscles tight and your leg straight, lift your affected leg up so that your heel is about 12 inches off the floor. Hold for about 6 seconds, then lower slowly.  4. Relax for up to 10 seconds between repetitions.  5. Repeat 8 to 12 times.  6. Switch legs and repeat steps 1 through 5, even if only one knee is sore.  Active knee flexion    1. Lie on your stomach with your knees straight. If your kneecap is uncomfortable, roll up a washcloth and put it under your leg just above your kneecap.  2. Lift the foot of your affected leg by bending the knee so that you bring the foot up toward your buttock. If this motion hurts, try it without bending your knee quite as far. This may help you avoid any painful motion.  3. Slowly move your leg up and down.  4. Repeat 8 to 12 times.  5. Switch legs and repeat steps 1 through 4, even if only one knee is sore.  Quadriceps stretch (facedown)    1. Lie flat on your stomach, and rest your face on the floor.  2. Wrap a towel or belt strap around the lower part of your affected leg. Then use the towel or belt strap to slowly pull your heel toward your buttock until you feel a stretch.  3. Hold for about 15 to 30 seconds, then relax your leg against the towel  or belt strap.  4. Repeat 2 to 4 times.  5. Switch legs and repeat steps 1 through 4, even if only one knee is sore.  Stationary exercise bike    If you do not have a stationary exercise bike at home, you can find one to ride at your local health club or community center.  1. Adjust the height of the bike seat so that your knee is slightly bent when your leg is extended downward. If your knee hurts when the pedal reaches the top, you can raise the seat so that your knee does not bend as much.  2. Start slowly. At first, try to do 5 to 10 minutes of cycling with little to no resistance. Then increase your time and the resistance bit by bit until you can do 20 to 30 minutes without pain.  3. If you start to have pain, rest your knee until your pain gets back to the level that is normal for you. Or cycle for less time or with less effort.  Follow-up care is a key part of your treatment and safety. Be sure to make and go to all appointments, and call your doctor if you are having problems. It's also a good idea to know your test results and keep a list of the medicines you take.  Where can you learn more?  Go to http://www.healthwise.net/GoodHelpConnections.  Enter C159 in the search box to learn more about "Knee Arthritis: Exercises."  Current as of: December 02, 2015  Content Version: 11.3  ?? 2006-2017 [employer]. Care instructions adapted under license by [employer] (which disclaims liability or warranty for this information). If you have questions about a medical condition or this instruction, always ask your healthcare professional. [employer] disclaims any warranty or liability for your use of   this information.

## 2016-03-13 LAB — ANA, RFLX TO 5-BIOMARKER PROFILE(ENA)
ANA, DIRECT: NEGATIVE
Antinuclear Antibodies Direct: NEGATIVE

## 2016-03-13 LAB — METABOLIC PANEL, BASIC
BUN/Creatinine ratio: 14 (ref 10–24)
BUN: 11 mg/dL (ref 8–27)
CO2: 23 mmol/L (ref 18–29)
Calcium: 9.6 mg/dL (ref 8.6–10.2)
Chloride: 104 mmol/L (ref 96–106)
Creatinine: 0.78 mg/dL (ref 0.76–1.27)
GFR est AA: 113 mL/min/{1.73_m2} (ref >59)
GFR est non-AA: 97 mL/min/{1.73_m2} (ref >59)
Glucose: 88 mg/dL (ref 65–99)
Potassium: 4.9 mmol/L (ref 3.5–5.2)
Sodium: 146 mmol/L — ABNORMAL HIGH (ref 134–144)

## 2016-03-13 LAB — URIC ACID: Uric acid: 6.8 mg/dL (ref 3.7–8.6)

## 2016-03-13 LAB — RHEUMATOID FACTOR, QL: Rheumatoid factor: <10 IU/mL (ref 0.0–13.9)

## 2016-03-13 LAB — SED RATE (ESR): Sed rate (ESR): 20 mm/hr (ref 0–30)

## 2016-04-19 ENCOUNTER — Encounter

## 2016-04-19 MED ORDER — CLONIDINE 0.1 MG TAB
0.1 mg | ORAL_TABLET | ORAL | 11 refills | Status: DC
Start: 2016-04-19 — End: 2017-05-12

## 2016-05-03 ENCOUNTER — Encounter

## 2016-05-05 MED ORDER — DICLOFENAC 75 MG TAB, DELAYED RELEASE
75 mg | ORAL_TABLET | ORAL | 1 refills | Status: DC
Start: 2016-05-05 — End: 2017-02-05

## 2016-07-26 ENCOUNTER — Encounter

## 2016-07-26 MED ORDER — DICLOFENAC 75 MG TAB, DELAYED RELEASE
75 mg | ORAL_TABLET | ORAL | 2 refills | Status: DC
Start: 2016-07-26 — End: 2017-02-07

## 2017-02-05 ENCOUNTER — Encounter

## 2017-02-08 MED ORDER — DICLOFENAC 75 MG TAB, DELAYED RELEASE
75 mg | ORAL_TABLET | ORAL | 0 refills | Status: DC
Start: 2017-02-08 — End: 2017-03-12

## 2017-03-12 ENCOUNTER — Encounter

## 2017-03-13 NOTE — Telephone Encounter (Signed)
Approved for 1 month.  Needs office visit.  Last visit 02/10/16

## 2017-03-14 MED ORDER — DICLOFENAC 75 MG TAB, DELAYED RELEASE
75 mg | ORAL_TABLET | ORAL | 0 refills | Status: DC
Start: 2017-03-14 — End: 2017-05-12

## 2017-04-27 ENCOUNTER — Encounter

## 2017-04-28 NOTE — Telephone Encounter (Signed)
Last visit 01/2016.  Needs OV

## 2017-05-11 ENCOUNTER — Encounter

## 2017-05-11 NOTE — Telephone Encounter (Signed)
[facility]

## 2017-05-11 NOTE — Telephone Encounter (Signed)
Patient stated he was told he could not have a refill on the following medications diclofenac EC (VOLTAREN) 75 mg EC tablet and  cloNIDine HCl (CATAPRES) 0.1 mg tablet until patient was seen in the office. The patient has an appointment scheduled for 10/01 and would like to know if a refill of medication to last him until that date can be called in. Also patient can make it to any appointment in the evening at 3:30-4:00 pm if there is anything sooner available. Please call patient back at [phone] to let him know about the medication refill and if a sooner appointment is available.

## 2017-05-12 MED ORDER — DICLOFENAC 75 MG TAB, DELAYED RELEASE
75 mg | ORAL_TABLET | ORAL | 0 refills | Status: DC
Start: 2017-05-12 — End: 2017-06-29

## 2017-05-12 MED ORDER — CLONIDINE 0.1 MG TAB
0.1 mg | ORAL_TABLET | ORAL | 0 refills | Status: DC
Start: 2017-05-12 — End: 2017-06-29

## 2017-05-12 NOTE — Telephone Encounter (Signed)
LM for patient informing him that [facility] Allgood, NP will refill his medications for 1 month and an appt was scheduled for 06/02/17 at 3:30 PM

## 2017-05-12 NOTE — Telephone Encounter (Signed)
I can provide refills for 1 month.  Please load in encounter    I also have late appointments in afternoon the week of Sept 20th.  He can be rescheduled,

## 2017-06-02 ENCOUNTER — Encounter: Attending: Family | Primary: Family Medicine

## 2017-06-13 ENCOUNTER — Encounter: Attending: Family | Primary: Family Medicine

## 2017-06-19 ENCOUNTER — Encounter

## 2017-06-19 NOTE — Telephone Encounter (Signed)
Patient was given a 1 month supply previously.  He no showed appointment on 06/13/17.  Refills not approved.  Needs appointment

## 2017-06-20 NOTE — Telephone Encounter (Signed)
Pt appt 10/17

## 2017-06-25 ENCOUNTER — Encounter

## 2017-06-29 ENCOUNTER — Ambulatory Visit
Admit: 2017-06-29 | Discharge: 2017-06-29 | Payer: PRIVATE HEALTH INSURANCE | Attending: Family | Primary: Family Medicine

## 2017-06-29 DIAGNOSIS — I10 Essential (primary) hypertension: Secondary | ICD-10-CM

## 2017-06-29 MED ORDER — CLONIDINE 0.1 MG TAB
0.1 mg | ORAL_TABLET | ORAL | 11 refills | Status: DC
Start: 2017-06-29 — End: 2017-10-26

## 2017-06-29 MED ORDER — TRAMADOL 50 MG TAB
50 mg | ORAL_TABLET | Freq: Four times a day (QID) | ORAL | 0 refills | Status: DC | PRN
Start: 2017-06-29 — End: 2017-07-23

## 2017-06-29 MED ORDER — DICLOFENAC 75 MG TAB, DELAYED RELEASE
75 mg | ORAL_TABLET | ORAL | 5 refills | Status: DC
Start: 2017-06-29 — End: 2017-07-23

## 2017-06-29 NOTE — Progress Notes (Signed)
RECOMMENDATIONS:  Labs normal/stable.    Urine drug screen normal.

## 2017-06-29 NOTE — Progress Notes (Signed)
Chief Complaint   Patient presents with   ??? Hypertension     Follow Up     1. Have you been to the ER, urgent care clinic since your last visit?  Hospitalized since your last visit?No    2. Have you seen or consulted any other health care providers outside of the [facility] since your last visit?  Include any pap smears or colon screening. No     Health Maintenance Due   Topic Date Due   ??? Pneumococcal 19-64 Medium Risk (1 of 1 - PPSV23) 03/26/1973   ??? Shingrix Vaccine Age 71> (1 of 2) 03/26/2004   ??? FOBT Q 1 YEAR AGE 78-75  03/26/2004   ??? Influenza Age 36 to Adult  04/13/2017

## 2017-06-29 NOTE — Progress Notes (Signed)
HISTORY OF PRESENT ILLNESS  Kevin Davenport is a 61 y.o. male.  HPI  Patient comes in today for follow up knee pain.  Complains of cracking and swelling on kneecaps.  Has been going on almost 1 year.  No injury past or present.  Hx of RA in family.  Taking Advil with minimal relief.  Wife states she has given patient a hydrocodone (one of her pills) for patient when pain severe.  Has not done any PT.  Is wearing knee brace on left knee.  No hx of gout.  Does eat red meats, seafood, drinks beer.  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 Kevin Davenport is tolerating medications well, with no untoward side effects noted. He is able to stay more active with less discomfort with these current doses. The patient reports an average of 75% relief with this regimen.Marland Kitchen. He is informed of side effects, risks, and benefits of this regimen, and emphasizes that he derives a significant improvement in functionality and quality of life, and notes that non-opioid medications and therapies in the past have not offered significant benefit.  Retired from [employer], made furniture.  Patient states he was on knees a lot working.  Still works there part-time.   States compliance with clonidine.  Tries to follow low sodium diet.  No Known Allergies    Past Medical History:   Diagnosis Date   ??? Arthritis    ??? Other ill-defined conditions(799.89)     Pancreatitis   ??? Pancreatitis        Past Surgical History:   Procedure Laterality Date   ??? HX COLONOSCOPY         Social History     Socioeconomic History   ??? Marital status: MARRIED     Spouse name: Not on file   ??? Number of children: Not on file   ??? Years of education: Not on file   ??? Highest education level: Not on file   Social Needs   ??? Financial resource strain: Not on file   ??? Food insecurity - worry: Not on file   ??? Food insecurity - inability: Not on file   ??? Transportation needs - medical: Not on file    ??? Transportation needs - non-medical: Not on file   Occupational History   ??? Not on file   Tobacco Use   ??? Smoking status: Current Every Day Smoker     Packs/day: 0.50   ??? Smokeless tobacco: Never Used   ??? Tobacco comment: 1/2 ppd x 18 years   Substance and Sexual Activity   ??? Alcohol use: Yes     Alcohol/week: 6.0 oz     Types: 10 Cans of beer per week     Comment: Pt states only drinks on weekends.    ??? Drug use: No   ??? Sexual activity: Yes     Partners: Female   Other Topics Concern   ??? Not on file   Social History Narrative   ??? Not on file       Family History   Problem Relation Age of Onset   ??? Hypertension Mother    ??? Diabetes Mother    ??? Alzheimer Mother    ??? Hypertension Father        Current Outpatient Medications   Medication Sig   ??? diclofenac EC (VOLTAREN) 75 mg EC tablet TAKE 1 TABLET BY MOUTH TWICE DAILY AS NEEDED FOR  KNEE PAIN   ??? cloNIDine HCl (CATAPRES) 0.1 mg tablet TAKE 2 TABLETS BY MOUTH TWICE DAILY   ??? traMADol (ULTRAM) 50 mg tablet Take 1 Tab by mouth every six (6) hours as needed for Pain. Max Daily Amount: 200 mg.     No current facility-administered medications for this visit.      Review of Systems   Constitutional: Negative for chills and fever.   HENT: Negative.    Eyes: Negative.    Respiratory: Negative for shortness of breath.    Cardiovascular: Negative for chest pain and palpitations.   Gastrointestinal: Negative for abdominal pain, blood in stool, constipation, melena, nausea and vomiting.   Genitourinary: Negative for dysuria, frequency and urgency.   Musculoskeletal: Positive for joint pain (bilateral knee pain). Negative for falls.   Skin: Negative for itching and rash.   Neurological: Negative for tingling, sensory change and focal weakness.   Psychiatric/Behavioral: Negative for depression.     Vitals:    06/29/17 1509   BP: (!) 144/97   Pulse: 84   Resp: 18   Temp: 97.1 ??F (36.2 ??C)   TempSrc: Oral   SpO2: 96%   Weight: 184 lb 1.6 oz (83.5 kg)   Height: 5\' 9"  (1.753 m)      Physical Exam   Constitutional: He is oriented to person, place, and time. He appears well-developed and well-nourished.   Cardiovascular: Normal rate and regular rhythm.   Pulses:       Dorsalis pedis pulses are 2+ on the right side, and 2+ on the left side.   Pulmonary/Chest: Effort normal.   Musculoskeletal:        Right knee: He exhibits swelling. He exhibits normal range of motion, no effusion, no ecchymosis, no deformity, no laceration, no erythema, normal alignment, no LCL laxity, normal patellar mobility, no bony tenderness and no MCL laxity. No tenderness found. No medial joint line, no lateral joint line, no MCL, no LCL and no patellar tendon tenderness noted.        Left knee: He exhibits swelling and bony tenderness. He exhibits normal range of motion, no effusion, no ecchymosis, no deformity, no laceration, no erythema, normal alignment, no LCL laxity, normal patellar mobility and no MCL laxity. Tenderness found. Medial joint line and lateral joint line tenderness noted. No MCL, no LCL and no patellar tendon tenderness noted.   Neurological: He is alert and oriented to person, place, and time.   Skin: Skin is warm and dry.       ASSESSMENT and PLAN    ICD-10-CM ICD-9-CM    1. Essential hypertension with goal blood pressure less than 140/90 I10 401.9 cloNIDine HCl (CATAPRES) 0.1 mg tablet      CBC WITH AUTOMATED DIFF      METABOLIC PANEL, COMPREHENSIVE   2. Chronic pain of both knees M25.561 719.46 diclofenac EC (VOLTAREN) 75 mg EC tablet    M25.562 338.29 traMADol (ULTRAM) 50 mg tablet    G89.29  REFERRAL TO ORTHOPEDIC SURGERY      PAIN MGMT PANEL W/REFL, UR   3. Encounter for immunization Z23 V03.89 CANCELED: INFLUENZA VIRUS VAC QUAD,SPLIT,PRESV FREE SYRINGE IM      CANCELED: PR IMMUNIZ ADMIN,1 SINGLE/COMB VAC/TOXOID     Encounter Diagnoses   Name Primary?   ??? Essential hypertension with goal blood pressure less than 140/90 Yes   ??? Chronic pain of both knees    ??? Encounter for immunization       Orders Placed This Encounter   ???  CBC WITH AUTOMATED DIFF   ??? METABOLIC PANEL, COMPREHENSIVE   ??? PAIN MGMT PANEL W/REFL, UR   ??? REFERRAL TO ORTHOPEDIC SURGERY   ??? diclofenac EC (VOLTAREN) 75 mg EC tablet   ??? cloNIDine HCl (CATAPRES) 0.1 mg tablet   ??? traMADol (ULTRAM) 50 mg tablet     Diagnoses and all orders for this visit:    Essential hypertension with goal blood pressure less than 140/90 - patient has been without BP medication for 1-2 weeks.  Will refill clonidine  -     cloNIDine HCl (CATAPRES) 0.1 mg tablet; TAKE 2 TABLETS BY MOUTH TWICE DAILY  -     CBC WITH AUTOMATED DIFF  -     METABOLIC PANEL, COMPREHENSIVE    Chronic pain of both knees  -     diclofenac EC (VOLTAREN) 75 mg EC tablet; TAKE 1 TABLET BY MOUTH TWICE DAILY AS NEEDED FOR KNEE PAIN  -     traMADol (ULTRAM) 50 mg tablet; Take 1 Tab by mouth every six (6) hours as needed for Pain. Max Daily Amount: 200 mg.  -     REFERRAL TO ORTHOPEDIC SURGERY  -     PAIN MGMT PANEL W/REFL, UR  -    Pain contract completed  1. -    Pain medications are prescribed with the objective of pain relief and improved physical and psychosocial function in this patient.  2. Counseled patient on proper use of prescribed medications and reviewed opioid contract.  3. Counseled patient about chronic medical conditions and their relationship to anxiety and depression and recommended mental health support as needed.  4. Reviewed with patient self-help tools, home exercise, and lifestyle changes to assist the patient in self-management of symptoms.  5. Reviewed with patient the treatment plan, goals of treatment plan, and limitations of treatment plan, to include the potential for side effects from medications and procedures. If side effects occur, it is the responsibility of the patient to inform the clinic so that a change in the treatment plan can be made in a safe manner. The patient is advised that stopping prescribed medication may cause an increase in symptoms and  possible medication withdrawal symptoms. The patient is informed an emergency room evaluation may be necessary if this occurs.    Encounter for immunization      Follow-up Disposition:  Return in about 6 months (around 12/28/2017), or if symptoms worsen or fail to improve.  lab results and schedule of future lab studies reviewed with patient  reviewed diet, exercise and weight control    I have reviewed the patient's allergies and made any necessary changes. Medical, procedural, social and family histories have been reviewed and updated as medically indicated. I have reconciled and/or revised patient medications in the EMR.       I have discussed each diagnosis listed in this note with Kevin Levins and/or their family. I have discussed treatment options and the risk/benefit analysis of those options, including safe use of medications and possible medication side effects.  Through the use of shared decision making we have agreed to the above plan.      The patient has received an after-visit summary and questions were answered concerning future plans.     Islam Villescas C. Caroly Purewal, FNP-C    This note will not be viewable in MyChart.

## 2017-06-29 NOTE — Patient Instructions (Addendum)
Learning About Joint Injections  What are joint injections?    Joint injections are shots into a joint, such as the knee or shoulder. They are used to put in medicines, such as pain relievers and steroid medicines.  Steroids can be injected directly into a swollen and painful joint to reduce inflammation. A steroid shot can sometimes help with short-term pain relief when other treatments haven't worked. If steroid shots help, pain may improve for weeks or months.  How are they done?  First, the area over the joint will be cleaned. Your doctor may then use a tiny needle to numb the skin in the area where you will get the joint injection.  If a tiny needle is used to numb the area, your doctor will use another needle to inject the medicine. Your doctor may use a pain reliever, a steroid, or both. You may feel some pressure or discomfort.  The procedure takes 10 to 30 minutes. But the injection itself usually takes only a few minutes.  Your doctor may put ice on the area before you go home. You will probably go home soon after your shot.  What can you expect after a joint injection?  You may have numbness around the joint for a few hours.  If your shot included both a pain reliever and a steroid, then the pain will probably go away right away. But it might come back after a few hours. This might happen if the pain reliever wears off and the steroid hasn't started to work yet. Steroids don't always work. But when they do, the pain relief can last for several days to a few months or longer.  Your doctor may tell you to use ice on the area. You can also use ice if the pain comes back. Put ice or a cold pack on your joint for 10 to 20 minutes at a time. Put a thin cloth between the ice and your skin.  Follow your doctor's instructions carefully.  Follow-up care is a key part of your treatment and safety. Be sure to make and go to all appointments, and call your doctor if you are having  problems. It's also a good idea to know your test results and keep a list of the medicines you take.  Where can you learn more?  Go to http://www.healthwise.net/GoodHelpConnections.  Enter N961 in the search box to learn more about "Learning About Joint Injections."  Current as of: May 27, 2016  Content Version: 11.8  ?? 2006-2018 [employer]. Care instructions adapted under license by [employer] (which disclaims liability or warranty for this information). If you have questions about a medical condition or this instruction, always ask your healthcare professional. [employer] disclaims any warranty or liability for your use of this information.

## 2017-06-30 LAB — METABOLIC PANEL, COMPREHENSIVE
A-G Ratio: 1.3 (ref 1.2–2.2)
ALT (SGPT): 16 IU/L (ref 0–44)
AST (SGOT): 25 IU/L (ref 0–40)
Albumin: 4.3 g/dL (ref 3.6–4.8)
Alk. phosphatase: 86 IU/L (ref 39–117)
BUN/Creatinine ratio: 11 (ref 10–24)
BUN: 12 mg/dL (ref 8–27)
Bilirubin, total: 0.6 mg/dL (ref 0.0–1.2)
CO2: 25 mmol/L (ref 20–29)
Calcium: 9.5 mg/dL (ref 8.6–10.2)
Chloride: 102 mmol/L (ref 96–106)
Creatinine: 1.06 mg/dL (ref 0.76–1.27)
GFR est AA: 86 mL/min/{1.73_m2} (ref >59)
GFR est non-AA: 74 mL/min/{1.73_m2} (ref >59)
GLOBULIN, TOTAL: 3.4 g/dL (ref 1.5–4.5)
Glucose: 86 mg/dL (ref 65–99)
Potassium: 4.2 mmol/L (ref 3.5–5.2)
Protein, total: 7.7 g/dL (ref 6.0–8.5)
Sodium: 142 mmol/L (ref 134–144)

## 2017-06-30 LAB — CBC WITH AUTOMATED DIFF
ABS. BASOPHILS: 0 10*3/uL (ref 0.0–0.2)
ABS. EOSINOPHILS: 0.1 10*3/uL (ref 0.0–0.4)
ABS. IMM. GRANS.: 0 10*3/uL (ref 0.0–0.1)
ABS. MONOCYTES: 0.5 10*3/uL (ref 0.1–0.9)
ABS. NEUTROPHILS: 5 10*3/uL (ref 1.4–7.0)
Abs Lymphocytes: 1.7 10*3/uL (ref 0.7–3.1)
BASOPHILS: 0 %
EOSINOPHILS: 1 %
HCT: 42 % (ref 37.5–51.0)
HGB: 14.3 g/dL (ref 13.0–17.7)
IMMATURE GRANULOCYTES: 0 %
Lymphocytes: 23 %
MCH: 31.8 pg (ref 26.6–33.0)
MCHC: 34 g/dL (ref 31.5–35.7)
MCV: 93 fL (ref 79–97)
MONOCYTES: 7 %
NEUTROPHILS: 69 %
PLATELET: 216 10*3/uL (ref 150–379)
RBC: 4.5 x10E6/uL (ref 4.14–5.80)
RDW: 14.5 % (ref 12.3–15.4)
WBC: 7.3 10*3/uL (ref 3.4–10.8)

## 2017-06-30 LAB — PAIN MGMT PANEL, URINE W/RFLX CONFIRM
Amphetamine Screen, urine: NEGATIVE ng/mL
Barbiturates Screen, urine: NEGATIVE ng/mL
Benzodiazepines Screen, urine: NEGATIVE ng/mL
Cannabinoid Screen, urine: NEGATIVE ng/mL
Cocaine Metab. Screen, urine: NEGATIVE ng/mL
Creatinine, urine: 179.2 mg/dL (ref 20.0–300.0)
FENTANYL, URINE: NEGATIVE pg/mL
Meperidine screen: NEGATIVE ng/mL
Methadone Screen, urine: NEGATIVE ng/mL
Opiate Screen, urine: NEGATIVE ng/mL
Oxycodone/Oxymorphone, urine: NEGATIVE ng/mL
Phencyclidine Screen, urine: NEGATIVE ng/mL
Propoxyphene Screen, urine: NEGATIVE ng/mL
Specific Gravity: 1.016
Tramadol Screen, urine: NEGATIVE ng/mL
pH, urine: 6.5 (ref 4.5–8.9)

## 2017-06-30 NOTE — Telephone Encounter (Addendum)
Spoke with pt and informed [facility]. Pt verbalized understanding.

## 2017-06-30 NOTE — Telephone Encounter (Signed)
Patient called stating that office notes and labs need to be faxed over to [facility] and an appointment needs to be scheduled for the 3rd Wednesday in Nov. Patient can be reached at [phone]. Please fax noted to [phone]. Office phone # [phone].

## 2017-07-01 NOTE — Telephone Encounter (Signed)
Patient called stating that the medication that was prescribed is making him dizzy to the point where he missed work for two days because he is unable to get out of the bed. Patient would like to know if he would be able to get hydrocodone instead. Patient can be reached at [phone].

## 2017-07-01 NOTE — Telephone Encounter (Signed)
Not able to leave message due to voicemail being full. Need to inquire about which medication makes him dizzy.

## 2017-07-04 NOTE — Progress Notes (Signed)
Received notice from [facility] pt is scheduled for appt 07/08/17 at 2:10 PM

## 2017-07-05 NOTE — Telephone Encounter (Signed)
Patient has been taking diclofenac since June 2017, doubt the reaction is related to that medication.  Have patient stop tramadol.  He can alternate tylenol with diclofenac.  I am not able to prescribe opiates (hydrocodone) without an office visit.

## 2017-07-05 NOTE — Telephone Encounter (Signed)
LM for pt and informed would need office visit for hydrocodone and to alternate tylenol and diclofenac. Informed to call office with any questions.

## 2017-07-20 ENCOUNTER — Other Ambulatory Visit: Payer: Self-pay | Admitting: Orthopedic Surgery

## 2017-07-20 DIAGNOSIS — M25562 Pain in left knee: Secondary | ICD-10-CM

## 2017-07-23 DIAGNOSIS — S61216A Laceration without foreign body of right little finger without damage to nail, initial encounter: Secondary | ICD-10-CM

## 2017-07-23 NOTE — ED Notes (Signed)
PA at the bedside suturing patient

## 2017-07-23 NOTE — ED Notes (Signed)
Cleansed wound with saline. Suture tray to bedside

## 2017-07-23 NOTE — ED Notes (Signed)
Patient educated on discharge instructions and 1 prescriptions . Patient verbalized understanding of education. Patient given discharge instructions and 1 prescriptions. Patient ambulated out of ED. No acute distress noted.

## 2017-07-23 NOTE — ED Notes (Signed)
Patient presents to ED with c/o laceration to finger. Patient states that approx 1 half hour ago he cut his finger on wood cabinet. Noted to have a 2cm laceration to right hand base of 5th digit.    Emergency Department Nursing Plan of Care       The Nursing Plan of Care is developed from the Nursing assessment and Emergency Department Attending provider initial evaluation.  The plan of care may be reviewed in the ???ED Provider note???.    The Plan of Care was developed with the following considerations:   Patient / Family readiness to learn indicated GN:[id number]:verbalized understanding  Persons(s) to be included in education: patient  Barriers to Learning/Limitations:No    Signed     Dionna Varney BaasM Coleman, RN    07/23/2017   9:24 PM

## 2017-07-23 NOTE — ED Provider Notes (Signed)
EMERGENCY DEPARTMENT HISTORY AND PHYSICAL EXAM      Date: 07/23/2017  Patient Name: Kevin Davenport    History of Presenting Illness     Chief Complaint   Patient presents with   ??? Laceration     Laceration on right hand fifth digit x 30 minutes. Patient says he cut his finger on wood cabinet.        History Provided By: Patient    HPI: Kevin Davenport, 61 y.o. male with PMHx significant for arthritis, presents ambulatory to the ED with cc of a laceration to the right hand 5th digit secondary to cutting it on a wood cabinet ~ 30 minutes PTA. He endorses that he is right handed. Pt notes that he is not UTD on his tetanus. He denies taking any OTC medications for his pain or applying anything to the wound. Pt specifically denies any fever, chills, numbness, weakness, SOB, CP, HA, N/V/D, abdominal pain, dysuria, hematuria, or rash.    There are no other complaints, changes, or physical findings at this time.    Social Hx: + EtOH (occasional); + Smoker (1/2 ppd); - Illicit Drugs  Family Hx: HTN, DM, Alzheimer's    PCP: Allgood, Alveda ReasonsSherry C, NP    Current Facility-Administered Medications   Medication Dose Route Frequency Provider Last Rate Last Dose   ??? diph,Pertuss(AC),Tet Vac-PF (BOOSTRIX) suspension 0.5 mL  0.5 mL IntraMUSCular PRIOR TO DISCHARGE Royanne FootsSequeira, Odes Lolli, MD       ??? lidocaine (PF) (XYLOCAINE) 10 mg/mL (1 %) injection 10 mL  10 mL SubCUTAneous NOW Thomos LemonsKalhorn, Jesse, PA         Current Outpatient Medications   Medication Sig Dispense Refill   ??? naproxen (NAPROSYN) 500 mg tablet Take 1 Tab by mouth every twelve (12) hours as needed for Pain. 20 Tab 0   ??? cloNIDine HCl (CATAPRES) 0.1 mg tablet TAKE 2 TABLETS BY MOUTH TWICE DAILY 120 Tab 11       Past History     Past Medical History:  Past Medical History:   Diagnosis Date   ??? Arthritis    ??? Other ill-defined conditions(799.89)     Pancreatitis   ??? Pancreatitis        Past Surgical History:  Past Surgical History:   Procedure Laterality Date   ??? HX COLONOSCOPY          Family History:  Family History   Problem Relation Age of Onset   ??? Hypertension Mother    ??? Diabetes Mother    ??? Alzheimer Mother    ??? Hypertension Father        Social History:  Social History     Tobacco Use   ??? Smoking status: Current Every Day Smoker     Packs/day: 0.50   ??? Smokeless tobacco: Never Used   ??? Tobacco comment: 1/2 ppd x 18 years   Substance Use Topics   ??? Alcohol use: Yes     Alcohol/week: 6.0 oz     Types: 10 Cans of beer per week     Comment: Pt states only drinks on weekends.    ??? Drug use: No       Allergies:  No Known Allergies    Review of Systems   Review of Systems   Constitutional: Negative.  Negative for chills, diaphoresis and fever.   HENT: Negative.  Negative for congestion, ear pain, sore throat and trouble swallowing.    Eyes: Negative.  Negative for photophobia, pain, redness and visual disturbance.  Respiratory: Negative.  Negative for cough, chest tightness, shortness of breath and wheezing.    Cardiovascular: Negative.  Negative for chest pain and palpitations.   Gastrointestinal: Negative.  Negative for abdominal pain, blood in stool, diarrhea, nausea and vomiting.   Genitourinary: Negative for dysuria, frequency and hematuria.   Musculoskeletal: Negative.  Negative for back pain, joint swelling and neck pain.   Skin: Positive for wound (laceration to base of R pinky). Negative for rash.   Neurological: Negative.  Negative for seizures, syncope, weakness, numbness and headaches.   Psychiatric/Behavioral: Negative.  Negative for behavioral problems and confusion. The patient is not nervous/anxious.    All other systems reviewed and are negative.    Physical Exam   Physical Exam   Constitutional: He is oriented to person, place, and time. He appears well-developed and well-nourished.   HENT:   Head: Normocephalic and atraumatic.   Eyes: Conjunctivae and EOM are normal.   Neck: Normal range of motion. Neck supple.   Cardiovascular: Normal rate and regular rhythm.    Pulmonary/Chest: Effort normal and breath sounds normal. No respiratory distress.   Abdominal: Soft. He exhibits no distension. There is no tenderness.   Musculoskeletal: Normal range of motion.   Neurological: He is alert and oriented to person, place, and time.   Skin: Skin is warm and dry.   Deep 2 cm laceration to base of R pinky palmar aspect. Pt able to make complete fist.    Psychiatric: He has a normal mood and affect.   Nursing note and vitals reviewed.    Diagnostic Study Results     Labs -   No results found for this or any previous visit (from the past 12 hour(s)).    Radiologic Studies -   No orders to display     CT Results  (Last 48 hours)    None        CXR Results  (Last 48 hours)    None          Medical Decision Making   I am the first provider for this patient.    I reviewed the vital signs, available nursing notes, past medical history, past surgical history, family history and social history.    Vital Signs-Reviewed the patient's vital signs.  Patient Vitals for the past 12 hrs:   Temp Pulse Resp BP SpO2   07/23/17 2057 98.4 ??F (36.9 ??C) 85 18 167/86 100 %       Pulse Oximetry Analysis - 100% on RA    Cardiac Monitor:   Rate: 85 bpm  Rhythm: Normal Sinus Rhythm      Records Reviewed: Nursing Notes, Old Medical Records, Previous Radiology Studies and Previous Laboratory Studies    Provider Notes (Medical Decision Making):   Patient presents with laceration.DDx: simple laceration vs complex laceration (open fracture, foreign body retention).      The wound has been explored well without foreign bodies noted and closed appropriately under sterile technique. Laboratory tests, medications, x-rays, diagnosis, follow up plan and return instructions have been reviewed and discussed with the patient. The patienthas had the opportunity to ask questions about their care.  The patient expresses understanding and agreement with diagnosis, follow up and return  instructions.  The patient agrees to return for suture removal in the discussed time period and expresses understanding that leaving sutures in place for longer periods will lead to scarring and infection.  The patient expresses understanding that although appropriate care was taken in wound  management that scarring and infection can still occur.  Tetanus has been updated if necessary.      ED Course:   Initial assessment performed. The patients presenting problems have been discussed, and they are in agreement with the care plan formulated and outlined with them.  I have encouraged them to ask questions as they arise throughout their visit.    Procedure Note - Laceration Repair:  10:14 PM  Procedure by Thomos LemonsJesse Kalhorn PA-C  Complexity: simple  2 cm linear laceration to medial aspect of R palm at base of pinky finger  was irrigated copiously with NS under jet lavage, prepped with Betadine, 250 cc of NS and draped in a sterile fashion.  The area was anesthetized with 4 mLs of  Lidocaine 1% without epinephrine via local infiltration.  The wound was explored with the following results: No foreign bodies found.  The wound was repaired with One layer suture closure: Skin Layer:  6 sutures placed, stitch type:simple interrupted, suture: 5-0 nylon..  The wound was closed with good hemostasis and approximation.  Sterile dressing applied.  Estimated blood loss: 5 cc  The procedure took 15 minutes, and pt tolerated well.    Critical Care Time:   0    Disposition:  Discharge Note:  10:26 PM  The patient has been re-evaluated and is ready for discharge. Reviewed available results with patient. Counseled patient/parent/guardian on diagnosis and care plan. Patient has expressed understanding, and all questions have been answered. Patient agrees with plan and agrees to follow up as recommended, or return to the ED if their symptoms worsen. Discharge instructions have been provided and explained to the patient,  along with reasons to return to the ED.    PLAN:  1.   Current Discharge Medication List      START taking these medications    Details   naproxen (NAPROSYN) 500 mg tablet Take 1 Tab by mouth every twelve (12) hours as needed for Pain.  Qty: 20 Tab, Refills: 0           2.   Follow-up Information     Follow up With Specialties Details Why Contact Info    Romeo RabonAllgood, Sherry C, NP Nurse Practitioner  For suture removal [address]  [place] [id number]  [phone]          Return to ED if worse     Diagnosis     Clinical Impression:   1. Laceration of right ring finger without foreign body without damage to nail, initial encounter        Attestations:  This note is prepared by Micayla L. Peterson AoAlbers, acting as Neurosurgeoncribe for Royanne FootsJoran Lecretia Buczek, MD    Royanne FootsJoran Slyvester Latona, MD: The scribe's documentation has been prepared under my direction and personally reviewed by me in its entirety. I confirm that the note above accurately reflects all work, treatment, procedures, and medical decision making performed by me.

## 2017-07-24 ENCOUNTER — Inpatient Hospital Stay
Admit: 2017-07-24 | Discharge: 2017-07-24 | Disposition: A | Discharge status: Home / Self Care | Payer: PRIVATE HEALTH INSURANCE | Attending: Emergency Medicine

## 2017-07-24 MED ORDER — NAPROXEN 500 MG TAB
500 mg | ORAL_TABLET | Freq: Two times a day (BID) | ORAL | 0 refills | Status: DC | PRN
Start: 2017-07-24 — End: 2017-11-15

## 2017-07-24 MED ORDER — LIDOCAINE (PF) 10 MG/ML (1 %) IJ SOLN
10 mg/mL (1 %) | INTRAMUSCULAR | Status: AC
Start: 2017-07-24 — End: 2017-07-23
  Administered 2017-07-24: 03:00:00 via SUBCUTANEOUS

## 2017-07-24 MED ORDER — DIPHTH,PERTUS(AC)TETANUS VAC(PF) 2.5 LF UNIT-8 MCG-5 LF/0.5 ML INJ
INTRAMUSCULAR | Status: AC
Start: 2017-07-24 — End: 2017-07-23
  Administered 2017-07-24: 04:00:00 via INTRAMUSCULAR

## 2017-07-24 MED FILL — BOOSTRIX TDAP 2.5 LF UNIT-8 MCG-5 LF/0.5 ML INTRAMUSCULAR SYRINGE: INTRAMUSCULAR | Qty: 0.5

## 2017-07-24 MED FILL — XYLOCAINE-MPF 10 MG/ML (1 %) INJECTION SOLUTION: 10 mg/mL (1 %) | INTRAMUSCULAR | Qty: 10

## 2017-07-28 ENCOUNTER — Ambulatory Visit
Admission: RE | Admit: 2017-07-28 | Discharge: 2017-07-28 | Disposition: A | Discharge status: Home / Self Care | Payer: Managed Care, Other (non HMO) | Source: Ambulatory Visit | Attending: Orthopedic Surgery | Admitting: Orthopedic Surgery

## 2017-07-28 DIAGNOSIS — M25562 Pain in left knee: Secondary | ICD-10-CM

## 2017-10-18 ENCOUNTER — Other Ambulatory Visit: Payer: Self-pay | Admitting: Dermatology

## 2017-10-26 ENCOUNTER — Ambulatory Visit
Admit: 2017-10-26 | Discharge: 2017-10-26 | Payer: PRIVATE HEALTH INSURANCE | Attending: Family Medicine | Primary: Family Medicine

## 2017-10-26 DIAGNOSIS — I10 Essential (primary) hypertension: Secondary | ICD-10-CM

## 2017-10-26 MED ORDER — CLONIDINE 0.1 MG TAB
0.1 mg | ORAL_TABLET | ORAL | 11 refills | Status: DC
Start: 2017-10-26 — End: 2017-11-15

## 2017-10-26 NOTE — Progress Notes (Unsigned)
Chief Complaint   Patient presents with   ??? Knee Swelling     bilateral     1. Have you been to the ER, urgent care clinic since your last visit?  Hospitalized since your last visit?No    2. Have you seen or consulted any other health care providers outside of the [facility] since your last visit?  Include any pap smears or colon screening. No    B/P elevated, doctor notified.  Pt stated he has been out of clonidine

## 2017-11-15 ENCOUNTER — Encounter: Admit: 2017-11-15 | Primary: Family Medicine

## 2017-11-15 ENCOUNTER — Ambulatory Visit
Admit: 2017-11-15 | Discharge: 2017-11-15 | Payer: PRIVATE HEALTH INSURANCE | Attending: Family Medicine | Primary: Family Medicine

## 2017-11-15 DIAGNOSIS — G8929 Other chronic pain: Secondary | ICD-10-CM

## 2017-11-15 MED ORDER — ATORVASTATIN 20 MG TAB
20 mg | ORAL_TABLET | Freq: Every day | ORAL | 3 refills | Status: DC
Start: 2017-11-15 — End: 2019-02-21

## 2017-11-15 MED ORDER — IBUPROFEN 800 MG TAB
800 mg | ORAL_TABLET | Freq: Three times a day (TID) | ORAL | 12 refills | Status: DC | PRN
Start: 2017-11-15 — End: 2018-11-17

## 2017-11-15 MED ORDER — CLONIDINE 0.1 MG TAB
0.1 mg | ORAL_TABLET | ORAL | 11 refills | Status: DC
Start: 2017-11-15 — End: 2018-11-30

## 2017-11-15 MED ORDER — ASPIRIN 81 MG TAB, DELAYED RELEASE
81 mg | ORAL_TABLET | Freq: Every day | ORAL | 12 refills | Status: AC
Start: 2017-11-15 — End: ?

## 2017-11-15 MED ORDER — PREDNISONE 5 MG TAB
5 mg | ORAL_TABLET | Freq: Two times a day (BID) | ORAL | 0 refills | Status: DC
Start: 2017-11-15 — End: 2019-01-31

## 2017-11-15 NOTE — Progress Notes (Signed)
Chief Complaint   Patient presents with   ??? Knee Swelling     bilateral   ??? Hand Swelling     bilateral    ??? Hypertension     1. Have you been to the ER, urgent care clinic since your last visit?  Hospitalized since your last visit? Yes  [facility]  Cut finger and "knees gave out "     2. Have you seen or consulted any other health care providers outside of the [facility] since your last visit?  Include any pap smears or colon screening. No     Health Maintenance Due   Topic Date Due   ??? Shingrix Vaccine Age 64> (1 of 2) 03/26/2004

## 2017-11-15 NOTE — Progress Notes (Signed)
pc with pt. And wife. He is taking his atorvastatin .

## 2017-11-15 NOTE — Progress Notes (Signed)
HISTORY OF PRESENT ILLNESS  Kevin Davenport is a 62 y.o. male.  HPI Pt. Comes in for blood pressure check. No complaints of chest pain, shortness of breath, TIAs, claudication or edema. Has been out of bp meds for the last month    Has also been having swelling in knees for one year. Moderate pain in both knees. Pain is worse at night. Was taking some Naproxen- not helping all that much. Some stiffness in knees if sits for awhile. Has also been having pains in hands, can wake him up from sleep at night.   Also gets some soreness in L shoulder when walks around at work. Also can wake up at night with chest pains. Some claudication in legs when walks.     ROS    Physical Exam   Constitutional: He appears well-developed and well-nourished.   HENT:   Right Ear: External ear normal.   Left Ear: External ear normal.   Mouth/Throat: Oropharynx is clear and moist.   Neck: No thyromegaly present.   Cardiovascular: Normal rate, regular rhythm and normal heart sounds.   Absent distal pulses   Pulmonary/Chest: Effort normal and breath sounds normal. No respiratory distress. He has no wheezes.   Abdominal: Soft. Bowel sounds are normal. He exhibits no distension and no mass. There is no tenderness. There is no guarding.   Musculoskeletal: Normal range of motion. He exhibits no edema.   R knee- moderate effusion. Moderate crepitus   L knee- moderate crepitus.   Hands- mild swelling mcps and pips, mild tenderness   Lymphadenopathy:     He has no cervical adenopathy.   Nursing note and vitals reviewed.      ASSESSMENT and PLAN  Orders Placed This Encounter   ??? XR KNEE RT MAX 2 VWS   ??? CBC WITH AUTOMATED DIFF   ??? SED RATE (ESR)   ??? RHEUMATOID FACTOR, QT   ??? CYCLIC CITRUL PEPTIDE AB, IGG   ??? URIC ACID   ??? LIPID PANEL   ??? [facility] Int Card Ref [facility]   ??? AMB POC EKG ROUTINE W/ 12 LEADS, INTER & REP   ??? cloNIDine HCl (CATAPRES) 0.1 mg tablet   ??? ibuprofen (MOTRIN) 800 mg tablet   ??? predniSONE (DELTASONE) 5 mg tablet    ??? aspirin delayed-release 81 mg tablet   ??? atorvastatin (LIPITOR) 20 mg tablet     Diagnoses and all orders for this visit:    1. Chronic pain of right knee  -     XR KNEE RT MAX 2 VWS; Future  -     URIC ACID    2. Essential hypertension with goal blood pressure less than 140/90  -     cloNIDine HCl (CATAPRES) 0.1 mg tablet; TAKE 2 TABLETS BY MOUTH TWICE DAILY    3. Swelling of both hands  -     CBC WITH AUTOMATED DIFF  -     SED RATE (ESR)  -     RHEUMATOID FACTOR, QT  -     CYCLIC CITRUL PEPTIDE AB, IGG    4. Pain of right hand    5. Hypercholesterolemia  -     LIPID PANEL    6. Other chest pain  -     AMB POC EKG ROUTINE W/ 12 LEADS, INTER & REP  -     REFERRAL TO CARDIOLOGY    7. Claudication (HCC)    Other orders  -     ibuprofen (MOTRIN) 800  mg tablet; Take 1 Tab by mouth every eight (8) hours as needed for Pain.  -     predniSONE (DELTASONE) 5 mg tablet; Take 1 Tab by mouth two (2) times a day.  -     aspirin delayed-release 81 mg tablet; Take 1 Tab by mouth daily.  -     atorvastatin (LIPITOR) 20 mg tablet; Take 1 Tab by mouth daily. For cholesterol      Follow-up Disposition:  Return in about 4 weeks (around 12/13/2017).

## 2017-11-17 LAB — LIPID PANEL
Cholesterol, total: 229 mg/dL — ABNORMAL HIGH (ref 100–199)
HDL Cholesterol: 78 mg/dL (ref >39)
LDL, calculated: 132 mg/dL — ABNORMAL HIGH (ref 0–99)
Triglyceride: 94 mg/dL (ref 0–149)
VLDL, calculated: 19 mg/dL (ref 5–40)

## 2017-11-17 LAB — CBC WITH AUTOMATED DIFF
ABS. BASOPHILS: 0.1 10*3/uL (ref 0.0–0.2)
ABS. EOSINOPHILS: 0 10*3/uL (ref 0.0–0.4)
ABS. IMM. GRANS.: 0 10*3/uL (ref 0.0–0.1)
ABS. MONOCYTES: 0.4 10*3/uL (ref 0.1–0.9)
ABS. NEUTROPHILS: 3.5 10*3/uL (ref 1.4–7.0)
Abs Lymphocytes: 1.6 10*3/uL (ref 0.7–3.1)
BASOPHILS: 1 %
EOSINOPHILS: 1 %
HCT: 43.3 % (ref 37.5–51.0)
HGB: 14.7 g/dL (ref 13.0–17.7)
IMMATURE GRANULOCYTES: 0 %
Lymphocytes: 28 %
MCH: 31.4 pg (ref 26.6–33.0)
MCHC: 33.9 g/dL (ref 31.5–35.7)
MCV: 93 fL (ref 79–97)
MONOCYTES: 7 %
NEUTROPHILS: 63 %
PLATELET: 314 10*3/uL (ref 150–379)
RBC: 4.68 x10E6/uL (ref 4.14–5.80)
RDW: 15.5 % — ABNORMAL HIGH (ref 12.3–15.4)
WBC: 5.6 10*3/uL (ref 3.4–10.8)

## 2017-11-17 LAB — CVD REPORT

## 2017-11-17 LAB — URIC ACID: Uric acid: 5.9 mg/dL (ref 3.7–8.6)

## 2017-11-17 LAB — CYCLIC CITRUL PEPTIDE AB, IGG: CCP Antibodies IgG/IgA: 12 units (ref 0–19)

## 2017-11-17 LAB — SED RATE (ESR): Sed rate (ESR): 27 mm/hr (ref 0–30)

## 2017-12-16 ENCOUNTER — Encounter: Attending: Family Medicine | Primary: Family Medicine

## 2018-04-19 ENCOUNTER — Other Ambulatory Visit: Payer: Self-pay | Admitting: Dermatology

## 2018-10-13 ENCOUNTER — Encounter

## 2018-10-24 ENCOUNTER — Encounter: Attending: Family Medicine | Primary: Family Medicine

## 2018-11-17 MED ORDER — IBUPROFEN 800 MG TAB
800 mg | ORAL_TABLET | Freq: Three times a day (TID) | ORAL | 12 refills | Status: DC | PRN
Start: 2018-11-17 — End: 2019-02-21

## 2018-11-17 NOTE — Telephone Encounter (Signed)
Last visit:11/15/17  Next visit: not scheduled- No show on 10/24/18  Previous refill 11/15/17  (90 + 12 R)    Requested Prescriptions     Pending Prescriptions Disp Refills   ??? ibuprofen (MOTRIN) 800 mg tablet 90 Tab 12     Sig: Take 1 Tab by mouth every eight (8) hours as needed for Pain.

## 2018-11-30 ENCOUNTER — Encounter

## 2018-11-30 NOTE — Telephone Encounter (Signed)
Last visit:10/24/18  Next visit:not scheduler   Previous refill  11/15/17(120+11R)    Requested Prescriptions     Pending Prescriptions Disp Refills   ??? cloNIDine HCL (CATAPRES) 0.1 mg tablet 120 Tab 11     Sig: TAKE 2 TABLETS BY MOUTH TWICE DAILY

## 2018-11-30 NOTE — Telephone Encounter (Signed)
Call and let patient know I changed the clonidine to a 0.2mg  tablet, so he only needs to take 1 tablet twice daily (instead of taking 2 tabs of 0.1mg  twice daily)

## 2018-12-01 MED ORDER — CLONIDINE 0.3 MG TAB
0.3 mg | ORAL_TABLET | ORAL | 3 refills | Status: DC
Start: 2018-12-01 — End: 2020-01-14

## 2018-12-01 NOTE — Telephone Encounter (Signed)
Verified patient with two type of identifiers. Informed pt of prescription change. Pt verbalized understanding.

## 2019-01-19 ENCOUNTER — Encounter: Payer: Self-pay | Admitting: Gastroenterology

## 2019-01-31 ENCOUNTER — Telehealth: Admit: 2019-01-31 | Payer: PRIVATE HEALTH INSURANCE | Attending: Family Medicine | Primary: Family Medicine

## 2019-01-31 ENCOUNTER — Telehealth: Attending: Family Medicine | Primary: Family Medicine

## 2019-01-31 DIAGNOSIS — G8929 Other chronic pain: Secondary | ICD-10-CM

## 2019-01-31 DIAGNOSIS — M25561 Pain in right knee: Secondary | ICD-10-CM

## 2019-01-31 MED ORDER — PREDNISONE 10 MG TAB
10 mg | ORAL_TABLET | Freq: Two times a day (BID) | ORAL | 0 refills | Status: DC
Start: 2019-01-31 — End: 2019-02-21

## 2019-01-31 MED ORDER — ACETAMINOPHEN 325 MG TABLET
325 mg | ORAL_TABLET | Freq: Four times a day (QID) | ORAL | 1 refills | Status: AC | PRN
Start: 2019-01-31 — End: ?

## 2019-01-31 MED ORDER — DICLOFENAC 75 MG TAB, DELAYED RELEASE
75 mg | ORAL_TABLET | Freq: Two times a day (BID) | ORAL | 5 refills | Status: AC
Start: 2019-01-31 — End: ?

## 2019-01-31 NOTE — Progress Notes (Signed)
 Chief Complaint   Patient presents with   . Knee Pain     bilateral knee pain and swelling     1. Have you been to the ER, urgent care clinic since your last visit?  Hospitalized since your last visit?No    2. Have you seen or consulted any other health care providers outside of the [facility] since your last visit?  Include any pap smears or colon screening. No     Ongoing three years.  Now interfering with ADL

## 2019-01-31 NOTE — Progress Notes (Signed)
HISTORY OF PRESENT ILLNESS  Kevin Davenport is a 63 y.o. male.  HPI Consent:  Pt. was seen by synchronous (real-time) audio- video technololgy, and/or her healthcare decision maker, is aware that this patient-initiated Telehealth encounter on   is a billable service, with coverage as determined by her insurance carrier. They are aware that they may receive a bill.  Pt has been having bilateral knee pain for years, slowly getting  Worse. L is worse than R. Pain can keep him up at night. Still going to work. Very stiff if sits for awhile. Had R knee xrayed last year- djd. Works Holiday representative at [employer], walks a lot at work. Ibuprofen 800 mg and naproxen 500 mg havent helped that much. Will get on medicare in July, wants to wait until then to consider a knee replacement. Sister has had a knee replacement.     ROS    Physical Exam    ASSESSMENT and PLAN  Orders Placed This Encounter   ??? diclofenac EC (VOLTAREN) 75 mg EC tablet   ??? predniSONE (DELTASONE) 10 mg tablet   ??? acetaminophen (TYLENOL) 325 mg tablet     Diagnoses and all orders for this visit:    1. Chronic pain of both knees    Other orders  -     diclofenac EC (VOLTAREN) 75 mg EC tablet; Take 1 Tab by mouth two (2) times a day. For knee pain  -     predniSONE (DELTASONE) 10 mg tablet; Take 10 mg by mouth two (2) times a day. For knee pain  -     acetaminophen (TYLENOL) 325 mg tablet; Take 2 Tabs by mouth every six (6) hours as needed for Pain.        Pt was evaluated by a video visit encounter for concerns as discussed above. A caregiver was present when appropriate. Due to this being a Scientist, research (medical) (DURING COVID-19 PUBLIC HEALTH EMERGENCY) physical exam was limited. Pursuant to the emergency declaration under the [facility] Act and the [employer], this Virtual Visit was conducted, with patients (and/or legal guardians's) consent, to reduce the patient's risk of exposure to COVID -19 and provide necessary medical care.   Services were  provided through a video synchronous discussion virtually to substitute for in-person clinic visit. Patient and provider were located at their individual homes.

## 2019-01-31 NOTE — Progress Notes (Signed)
HISTORY OF PRESENT ILLNESS  Kevin Davenport is a 63 y.o. male.  HPI Consent:  Pt. was seen by synchronous (real-time) audio- video technololgy, and/or her healthcare decision maker, is aware that this patient-initiated Telehealth encounter on   is a billable service, with coverage as determined by her insurance carrier. They are aware that they may receive a bill.  Pt has been having bilateral knee pain for years, slowly getting  Worse. L is worse than R. Pain can keep him up at night. Still going to work. Very stiff if sits for awhile. Had R knee xrayed last year- djd. Works Holiday representative at [employer], walks a lot at work. Ibuprofen 800 mg and naproxen 500 mg havent helped that much. Will get on medicare in July, wants to wait until then to consider a knee replacement. Sister has had a knee replacement.     ROS    Physical Exam    ASSESSMENT and PLAN  Orders Placed This Encounter   ??? diclofenac EC (VOLTAREN) 75 mg EC tablet   ??? predniSONE (DELTASONE) 10 mg tablet   ??? acetaminophen (TYLENOL) 325 mg tablet     Diagnoses and all orders for this visit:    1. Chronic pain of both knees    Other orders  -     diclofenac EC (VOLTAREN) 75 mg EC tablet; Take 1 Tab by mouth two (2) times a day. For knee pain  -     predniSONE (DELTASONE) 10 mg tablet; Take 10 mg by mouth two (2) times a day. For knee pain  -     acetaminophen (TYLENOL) 325 mg tablet; Take 2 Tabs by mouth every six (6) hours as needed for Pain.        Pt was evaluated by a video visit encounter for concerns as discussed above. A caregiver was present when appropriate. Due to this being a Scientist, research (medical) (DURING COVID-19 PUBLIC HEALTH EMERGENCY) physical exam was limited. Pursuant to the emergency declaration under the [facility] Act and the [employer], this Virtual Visit was conducted, with patients (and/or legal guardians's) consent, to reduce the patient's risk of exposure to COVID -19 and provide necessary medical care.    Services were provided through a video synchronous discussion virtually to substitute for in-person clinic visit. Patient and provider were located at their individual homes.

## 2019-01-31 NOTE — Progress Notes (Signed)
Chief Complaint   Patient presents with   ??? Knee Pain     bilateral knee pain and swelling     1. Have you been to the ER, urgent care clinic since your last visit?  Hospitalized since your last visit?No    2. Have you seen or consulted any other health care providers outside of the [facility] since your last visit?  Include any pap smears or colon screening. No     Ongoing three years.  Now interfering with ADL

## 2019-02-21 ENCOUNTER — Encounter: Attending: Family Medicine | Primary: Family Medicine

## 2019-02-21 ENCOUNTER — Ambulatory Visit: Admit: 2019-02-21 | Payer: PRIVATE HEALTH INSURANCE | Attending: Family Medicine | Primary: Family Medicine

## 2019-02-21 ENCOUNTER — Ambulatory Visit: Attending: Family Medicine | Primary: Family Medicine

## 2019-02-21 DIAGNOSIS — M1712 Unilateral primary osteoarthritis, left knee: Secondary | ICD-10-CM

## 2019-02-21 MED ORDER — METHYLPREDNISOLONE 40 MG/ML SUSP FOR INJECTION
40 mg/mL | Freq: Once | INTRAMUSCULAR | 0 refills | Status: AC
Start: 2019-02-21 — End: 2019-02-21

## 2019-02-21 MED ORDER — ATORVASTATIN 20 MG TAB
20 mg | ORAL_TABLET | Freq: Every day | ORAL | 3 refills | Status: AC
Start: 2019-02-21 — End: ?

## 2019-02-21 NOTE — Progress Notes (Signed)
HISTORY OF PRESENT ILLNESS  Kevin Davenport is a 63 y.o. male.  HPI Has been having L knee pain for a few years. Moderate nighttime pain. Also walks all day at work. Pain gets worse if stands in one place for too long. Takes ibuprofen prn pain, helps some. No prior hx cortisone shots, but wants to try one.     ROS    Physical Exam  Vitals signs and nursing note reviewed.   Constitutional:       Appearance: He is well-developed.   HENT:      Right Ear: External ear normal.      Left Ear: External ear normal.   Neck:      Thyroid: No thyromegaly.   Cardiovascular:      Rate and Rhythm: Normal rate and regular rhythm.      Heart sounds: Normal heart sounds.   Pulmonary:      Effort: Pulmonary effort is normal. No respiratory distress.      Breath sounds: Normal breath sounds. No wheezing.   Abdominal:      General: Bowel sounds are normal. There is no distension.      Palpations: Abdomen is soft. There is no mass.      Tenderness: There is no abdominal tenderness. There is no guarding.   Musculoskeletal: Normal range of motion.      Comments: L knee- moderate crepitus. No effusion. No instability    Lymphadenopathy:      Cervical: No cervical adenopathy.         ASSESSMENT and PLAN  Orders Placed This Encounter   ??? (DEPO-MEDROL 40 mg  -  J1030 quantity 1 for Reimbursement) METHYLPREDNISOLONE ACETATE 40 mg injection   ??? 20610 - DRAIN/INJECT LARGE JOINT/BURSA   ??? atorvastatin (LIPITOR) 20 mg tablet   ??? methylPREDNISolone acetate (DEPO-MedroL) 40 mg/mL injection     Diagnoses and all orders for this visit:    1. Primary osteoarthritis of left knee  -     METHYLPREDNISOLONE ACETATE INJECTION 40 MG  -     methylPREDNISolone acetate (DEPO-MedroL) 40 mg/mL injection; 1 mL by IntraMUSCular route once for 1 dose.  -     PR DRAIN/INJECT LARGE JOINT/BURSA    Other orders  -     atorvastatin (LIPITOR) 20 mg tablet; Take 1 Tab by mouth daily. For cholesterol

## 2019-02-21 NOTE — Progress Notes (Signed)
 Chief Complaint   Patient presents with   . Knee Pain     left knee     1. Have you been to the ER, urgent care clinic since your last visit?  Hospitalized since your last visit?No    2. Have you seen or consulted any other health care providers outside of the [facility] since your last visit?  Include any pap smears or colon screening. No       Patient here for chronic left knee pain and possible injection.    [facility]  OFFICE PROCEDURE PROGRESS NOTE        Chart reviewed for the following:   I, Inocente MARLA Cooter, LPN, have reviewed the History, Physical and updated the Allergic reactions for Kevin Davenport     TIME OUT performed immediately prior to start of procedure:   I, Inocente MARLA Cooter, LPN, have performed the following reviews on Kevin Davenport prior to the start of the procedure:            * Patient was identified by name and date of birth   * Agreement on procedure being performed was verified  * Risks and Benefits explained to the patient  * Procedure site verified and marked as necessary  * Patient was positioned for comfort  * Consent was signed and verified     Time: 11:00 am      Date of procedure: 02/21/2019    Procedure performed by:  Ozell Fujisawa, MD    Provider assisted by: MARLA Cooter LPN    Patient assisted by: self    How tolerated by patient: tolerated the procedure well with no complications    Post Procedural Pain Scale: 2 - Hurts Little Bit    Comments: Procedure reviewed with patient by provider and consent signed for Depo Medrol Injection into left knee.

## 2019-02-21 NOTE — Progress Notes (Signed)
HISTORY OF PRESENT ILLNESS  Kevin Davenport is a 62 y.o. male.  HPI Has been having L knee pain for a few years. Moderate nighttime pain. Also walks all day at work. Pain gets worse if stands in one place for too long. Takes ibuprofen prn pain, helps some. No prior hx cortisone shots, but wants to try one.     ROS    Physical Exam  Vitals signs and nursing note reviewed.   Constitutional:       Appearance: He is well-developed.   HENT:      Right Ear: External ear normal.      Left Ear: External ear normal.   Neck:      Thyroid: No thyromegaly.   Cardiovascular:      Rate and Rhythm: Normal rate and regular rhythm.      Heart sounds: Normal heart sounds.   Pulmonary:      Effort: Pulmonary effort is normal. No respiratory distress.      Breath sounds: Normal breath sounds. No wheezing.   Abdominal:      General: Bowel sounds are normal. There is no distension.      Palpations: Abdomen is soft. There is no mass.      Tenderness: There is no abdominal tenderness. There is no guarding.   Musculoskeletal: Normal range of motion.      Comments: L knee- moderate crepitus. No effusion. No instability    Lymphadenopathy:      Cervical: No cervical adenopathy.         ASSESSMENT and PLAN  Orders Placed This Encounter   ??? (DEPO-MEDROL 40 mg  -  J1030 quantity 1 for Reimbursement) METHYLPREDNISOLONE ACETATE 40 mg injection   ??? 20610 - DRAIN/INJECT LARGE JOINT/BURSA   ??? atorvastatin (LIPITOR) 20 mg tablet   ??? methylPREDNISolone acetate (DEPO-MedroL) 40 mg/mL injection     Diagnoses and all orders for this visit:    1. Primary osteoarthritis of left knee  -     METHYLPREDNISOLONE ACETATE INJECTION 40 MG  -     methylPREDNISolone acetate (DEPO-MedroL) 40 mg/mL injection; 1 mL by IntraMUSCular route once for 1 dose.  -     PR DRAIN/INJECT LARGE JOINT/BURSA    Other orders  -     atorvastatin (LIPITOR) 20 mg tablet; Take 1 Tab by mouth daily. For cholesterol

## 2019-02-21 NOTE — Progress Notes (Signed)
Chief Complaint   Patient presents with   ??? Knee Pain     left knee     1. Have you been to the ER, urgent care clinic since your last visit?  Hospitalized since your last visit?No    2. Have you seen or consulted any other health care providers outside of the [facility] since your last visit?  Include any pap smears or colon screening. No       Patient here for chronic left knee pain and possible injection.    [facility]  OFFICE PROCEDURE PROGRESS NOTE        Chart reviewed for the following:   I, Aida Lemaire K Ryshawn Sanzone, LPN, have reviewed the History, Physical and updated the Allergic reactions for Rondy Bowie     TIME OUT performed immediately prior to start of procedure:   I, Cordney Barstow K Mansour Balboa, LPN, have performed the following reviews on Errik Wallenstein prior to the start of the procedure:            * Patient was identified by name and date of birth   * Agreement on procedure being performed was verified  * Risks and Benefits explained to the patient  * Procedure site verified and marked as necessary  * Patient was positioned for comfort  * Consent was signed and verified     Time: 11:00 am      Date of procedure: 02/21/2019    Procedure performed by:  Michael Sheehan, MD    Provider assisted by: K Everitt Wenner LPN    Patient assisted by: self    How tolerated by patient: tolerated the procedure well with no complications    Post Procedural Pain Scale: 2 - Hurts Little Bit    Comments: Procedure reviewed with patient by provider and consent signed for Depo Medrol Injection into left knee.

## 2019-03-27 ENCOUNTER — Encounter: Payer: Self-pay | Admitting: *Deleted

## 2019-04-05 ENCOUNTER — Ambulatory Visit: Admit: 2019-04-05 | Discharge: 2019-04-05 | Attending: Family Medicine | Primary: Family Medicine

## 2019-04-05 ENCOUNTER — Encounter: Admit: 2019-04-05 | Primary: Family Medicine

## 2019-04-05 ENCOUNTER — Inpatient Hospital Stay: Admit: 2019-05-10 | Payer: MEDICARE | Primary: Family Medicine

## 2019-04-05 ENCOUNTER — Ambulatory Visit: Attending: Family Medicine | Primary: Family Medicine

## 2019-04-05 DIAGNOSIS — R0781 Pleurodynia: Secondary | ICD-10-CM

## 2019-04-05 DIAGNOSIS — E7800 Pure hypercholesterolemia, unspecified: Secondary | ICD-10-CM

## 2019-04-05 NOTE — Progress Notes (Signed)
HISTORY OF PRESENT ILLNESS  Kevin Davenport is a 63 y.o. male.  HPI  In for checkup. Going for L hip replacement 8-6, Dr. Kelli Churn. iggest problem is L hip pain. Pain can keep him awake at night. Works at [employer], makes furniture. Also has a bump on chest that would like to have checked.   Review of Systems   Respiratory: Negative for cough and shortness of breath.         Smokes 1/2 ppd   Cardiovascular: Negative for chest pain, orthopnea and leg swelling.        No hx CAD   Neurological: Negative for loss of consciousness and weakness.       Physical Exam  Vitals signs and nursing note reviewed.   Constitutional:       Appearance: He is well-developed.   HENT:      Right Ear: External ear normal.      Left Ear: External ear normal.   Neck:      Thyroid: No thyromegaly.   Cardiovascular:      Rate and Rhythm: Normal rate and regular rhythm.      Heart sounds: Normal heart sounds.   Pulmonary:      Effort: Pulmonary effort is normal. No respiratory distress.      Breath sounds: Normal breath sounds. No wheezing.   Abdominal:      General: Bowel sounds are normal. There is no distension.      Palpations: Abdomen is soft. There is no mass.      Tenderness: There is no abdominal tenderness. There is no guarding.   Musculoskeletal: Normal range of motion.      Comments: Moderate swelling r upper rib area just to R of upper sternum   Lymphadenopathy:      Cervical: No cervical adenopathy.         ASSESSMENT and PLAN  Orders Placed This Encounter   ??? XR RIBS RT W PA CXR MIN 3 V   ??? METABOLIC PANEL, COMPREHENSIVE   ??? LIPID PANEL   ??? AMB POC COMPLETE CBC, AUTOMATED   ??? COLLECTION VENOUS BLOOD,VENIPUNCTURE     Diagnoses and all orders for this visit:    1. Rib pain on right side  -     XR RIBS RT W PA CXR MIN 3 V; Future  -     COLLECTION VENOUS BLOOD,VENIPUNCTURE    2. Hypercholesteremia  -     METABOLIC PANEL, COMPREHENSIVE  -     LIPID PANEL  -     AMB POC COMPLETE CBC, AUTOMATED  -     COLLECTION VENOUS  BLOOD,VENIPUNCTURE    3. Primary osteoarthritis of left hip

## 2019-04-05 NOTE — Progress Notes (Signed)
 Chief Complaint   Patient presents with   . Pre-op Exam     1. Have you been to the ER, urgent care clinic since your last visit?  Hospitalized since your last visit?No    2. Have you seen or consulted any other health care providers outside of the [facility] since your last visit?  Include any pap smears or colon screening. Yes Ortho - Dr. Charletta- 04/19/19 - surgery date.      Patient here for pre op exam.

## 2019-04-05 NOTE — Progress Notes (Signed)
Chief Complaint   Patient presents with   ??? Pre-op Exam     1. Have you been to the ER, urgent care clinic since your last visit?  Hospitalized since your last visit?No    2. Have you seen or consulted any other health care providers outside of the [facility] since your last visit?  Include any pap smears or colon screening. Yes Ortho - Dr. Shaia- 04/19/19 - surgery date.      Patient here for pre op exam.

## 2019-04-05 NOTE — Progress Notes (Signed)
HISTORY OF PRESENT ILLNESS  Kevin Davenport is a 63 y.o. male.  HPI  In for checkup. Going for L hip replacement 8-6, Dr. Kelli Churn. iggest problem is L hip pain. Pain can keep him awake at night. Works at [employer], makes furniture. Also has a bump on chest that would like to have checked.   Review of Systems   Respiratory: Negative for cough and shortness of breath.         Smokes 1/2 ppd   Cardiovascular: Negative for chest pain, orthopnea and leg swelling.        No hx CAD   Neurological: Negative for loss of consciousness and weakness.       Physical Exam  Vitals signs and nursing note reviewed.   Constitutional:       Appearance: He is well-developed.   HENT:      Right Ear: External ear normal.      Left Ear: External ear normal.   Neck:      Thyroid: No thyromegaly.   Cardiovascular:      Rate and Rhythm: Normal rate and regular rhythm.      Heart sounds: Normal heart sounds.   Pulmonary:      Effort: Pulmonary effort is normal. No respiratory distress.      Breath sounds: Normal breath sounds. No wheezing.   Abdominal:      General: Bowel sounds are normal. There is no distension.      Palpations: Abdomen is soft. There is no mass.      Tenderness: There is no abdominal tenderness. There is no guarding.   Musculoskeletal: Normal range of motion.      Comments: Moderate swelling r upper rib area just to R of upper sternum   Lymphadenopathy:      Cervical: No cervical adenopathy.         ASSESSMENT and PLAN  Orders Placed This Encounter   ??? XR RIBS RT W PA CXR MIN 3 V   ??? METABOLIC PANEL, COMPREHENSIVE   ??? LIPID PANEL   ??? AMB POC COMPLETE CBC, AUTOMATED   ??? COLLECTION VENOUS BLOOD,VENIPUNCTURE     Diagnoses and all orders for this visit:    1. Rib pain on right side  -     XR RIBS RT W PA CXR MIN 3 V; Future  -     COLLECTION VENOUS BLOOD,VENIPUNCTURE    2. Hypercholesteremia  -     METABOLIC PANEL, COMPREHENSIVE  -     LIPID PANEL  -     AMB POC COMPLETE CBC, AUTOMATED  -     COLLECTION VENOUS BLOOD,VENIPUNCTURE     3. Primary osteoarthritis of left hip

## 2019-04-06 LAB — COMPREHENSIVE METABOLIC PANEL
ALT: 31 IU/L (ref 0–44)
AST: 30 IU/L (ref 0–40)
Albumin/Globulin Ratio: 1.6 NA (ref 1.2–2.2)
Albumin: 4.6 g/dL (ref 3.8–4.8)
Alkaline Phosphatase: 75 IU/L (ref 39–117)
BUN: 12 mg/dL (ref 8–27)
Bun/Cre Ratio: 15 NA (ref 10–24)
CO2: 23 mmol/L (ref 20–29)
Calcium: 9.8 mg/dL (ref 8.6–10.2)
Chloride: 104 mmol/L (ref 96–106)
Creatinine: 0.82 mg/dL (ref 0.76–1.27)
EGFR IF NonAfrican American: 93 mL/min/{1.73_m2} (ref >59)
GFR African American: 107 mL/min/{1.73_m2} (ref >59)
Globulin, Total: 2.8 g/dL (ref 1.5–4.5)
Glucose: 84 mg/dL (ref 65–99)
Potassium: 4.1 mmol/L (ref 3.5–5.2)
Sodium: 144 mmol/L (ref 134–144)
Total Bilirubin: 0.5 mg/dL (ref 0.0–1.2)
Total Protein: 7.4 g/dL (ref 6.0–8.5)

## 2019-04-06 LAB — LIPID PANEL
Cholesterol, Total: 200 mg/dL — ABNORMAL HIGH (ref 100–199)
Cholesterol, total: 200 mg/dL — ABNORMAL HIGH (ref 100–199)
HDL Cholesterol: 72 mg/dL (ref >39)
HDL: 72 mg/dL (ref >39)
LDL Calculated: 112 mg/dL — ABNORMAL HIGH (ref 0–99)
LDL, calculated: 112 mg/dL — ABNORMAL HIGH (ref 0–99)
Triglyceride: 82 mg/dL (ref 0–149)
Triglycerides: 82 mg/dL (ref 0–149)
VLDL Cholesterol Calculated: 16 mg/dL (ref 5–40)
VLDL, calculated: 16 mg/dL (ref 5–40)

## 2019-04-06 LAB — CVD REPORT

## 2019-04-06 LAB — METABOLIC PANEL, COMPREHENSIVE
A-G Ratio: 1.6 (ref 1.2–2.2)
ALT (SGPT): 31 IU/L (ref 0–44)
AST (SGOT): 30 IU/L (ref 0–40)
Albumin: 4.6 g/dL (ref 3.8–4.8)
Alk. phosphatase: 75 IU/L (ref 39–117)
BUN/Creatinine ratio: 15 (ref 10–24)
BUN: 12 mg/dL (ref 8–27)
Bilirubin, total: 0.5 mg/dL (ref 0.0–1.2)
CO2: 23 mmol/L (ref 20–29)
Calcium: 9.8 mg/dL (ref 8.6–10.2)
Chloride: 104 mmol/L (ref 96–106)
Creatinine: 0.82 mg/dL (ref 0.76–1.27)
GFR est AA: 107 mL/min/{1.73_m2} (ref >59)
GFR est non-AA: 93 mL/min/{1.73_m2} (ref >59)
GLOBULIN, TOTAL: 2.8 g/dL (ref 1.5–4.5)
Glucose: 84 mg/dL (ref 65–99)
Potassium: 4.1 mmol/L (ref 3.5–5.2)
Protein, total: 7.4 g/dL (ref 6.0–8.5)
Sodium: 144 mmol/L (ref 134–144)

## 2019-04-11 NOTE — Telephone Encounter (Signed)
Faxed labs to [facility] in pre admission testing at [phone]

## 2019-04-11 NOTE — Telephone Encounter (Signed)
[facility]     Would like patients last lab work & EKG   He is there for Pre op testing     Best number to reach them is [phone]

## 2019-05-12 IMAGING — MR MR KNEE*L* W/O CM
4 of 6 series · 19 of 40 positions shown · non-contrast
Comparison: None.

CLINICAL DATA: Left knee pain.  Unknown chronicity.

EXAM:
MRI OF THE LEFT KNEE WITHOUT CONTRAST
TECHNIQUE: Multiplanar, multisequence MR imaging of the knee was performed. No
intravenous contrast was administered.

[Series 3: PD fat-sat · axial · 3.5mm · 0.33mm/px · z∈[-61,+56]mm · 8 of 29 slices shown (1 of 4)]
[im 1/29]
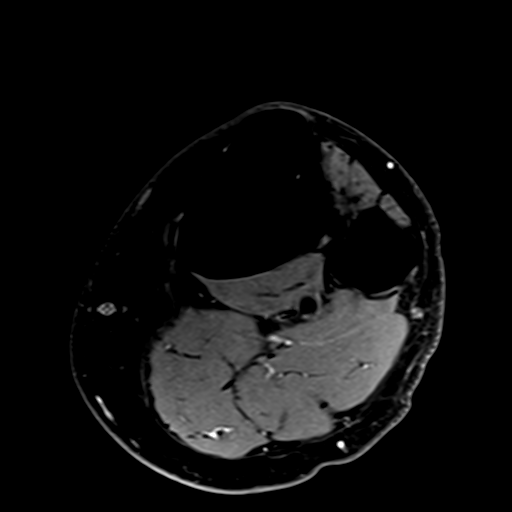
[im 5/29]
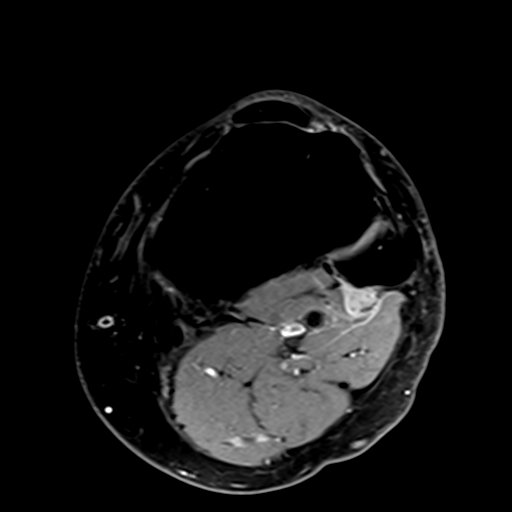
[im 9/29]
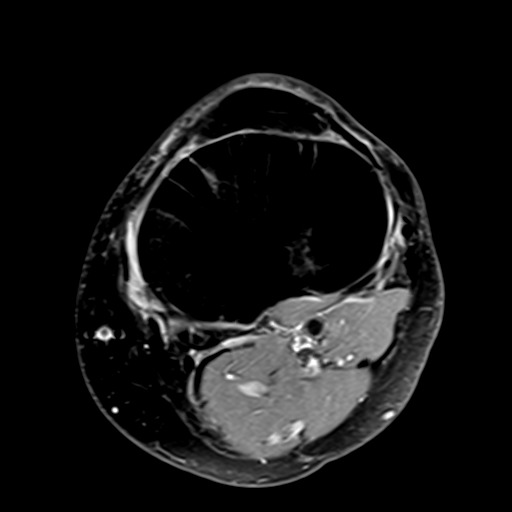
[im 13/29]
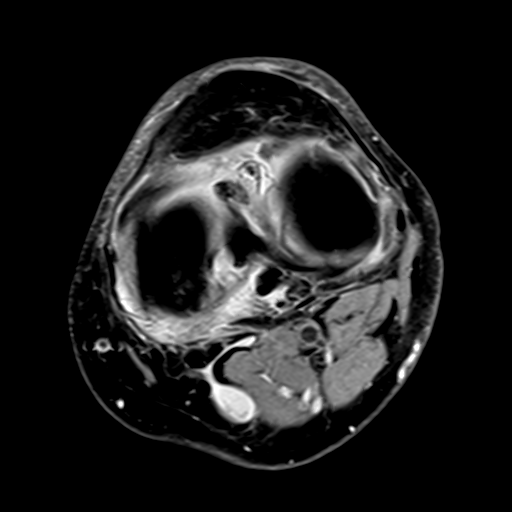
[im 17/29]
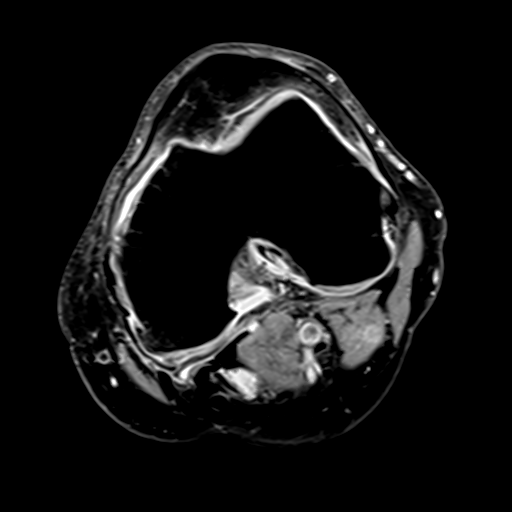
[im 21/29]
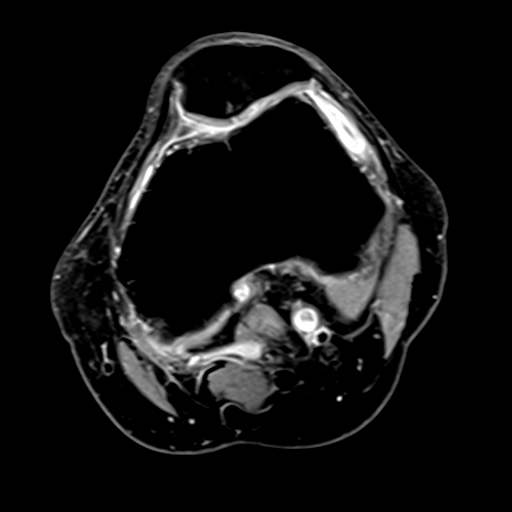
[im 25/29]
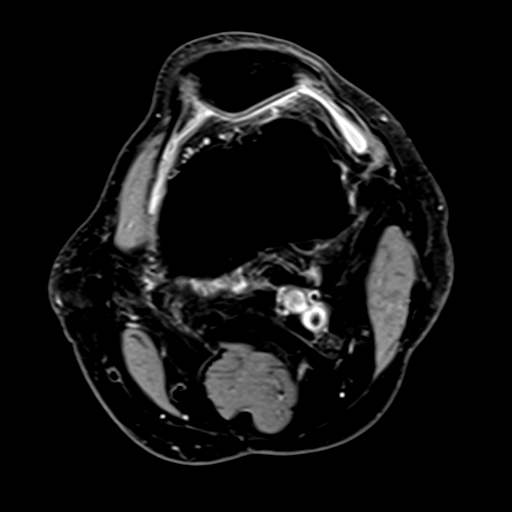
[im 29/29]
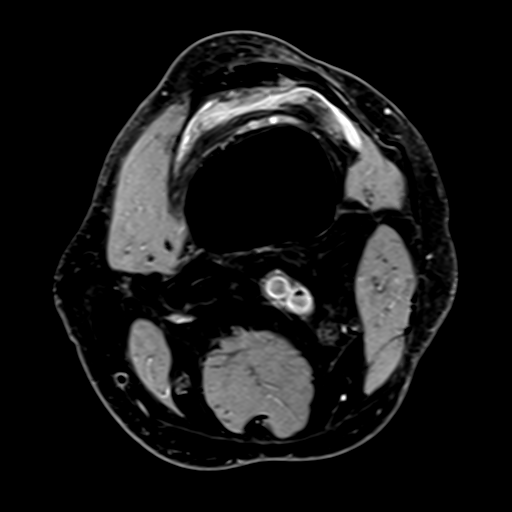

[Series 4: PD fat-sat · coronal · 3.5mm · 0.33mm/px · 5 of 27 slices shown (2 of 4)]
[im 1/27]
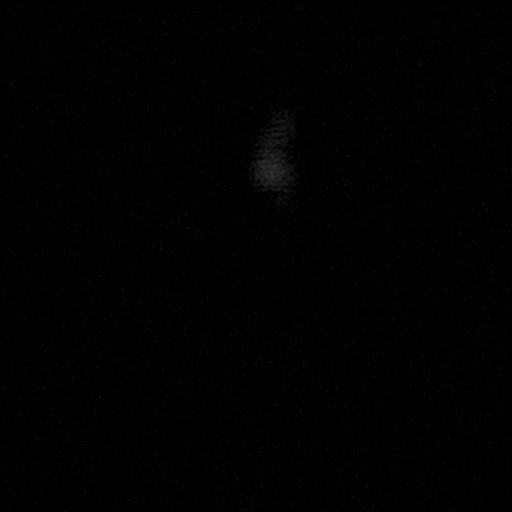
[im 5/27]
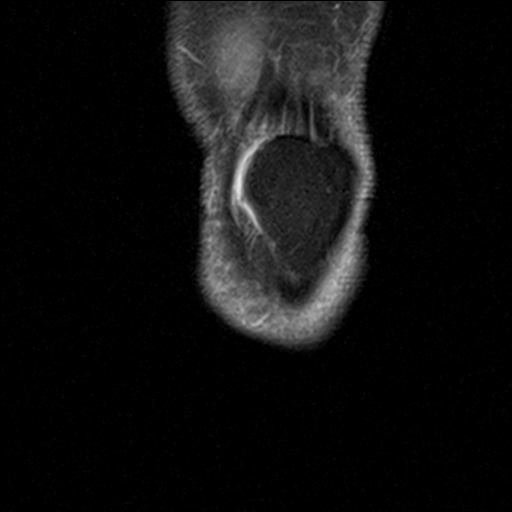
[im 9/27]
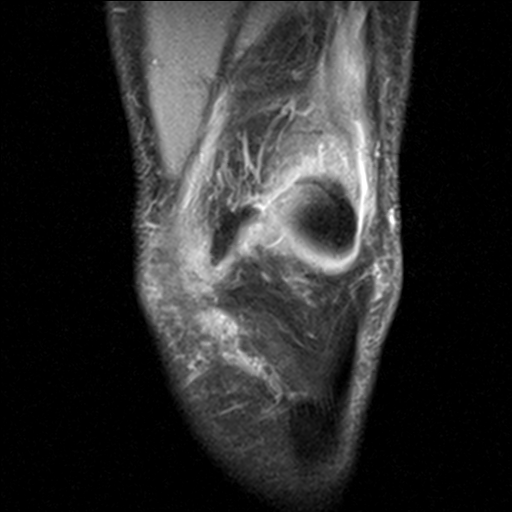
[im 14/27]
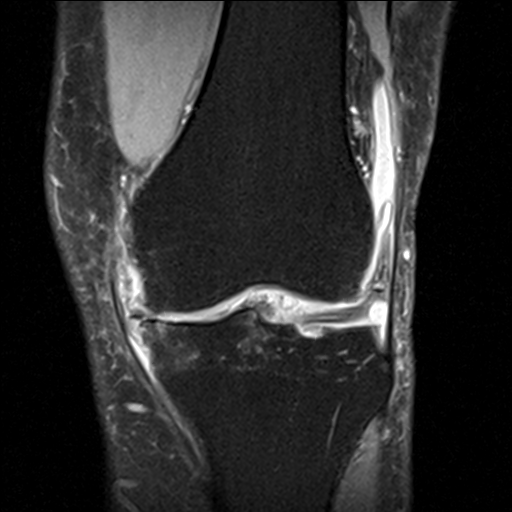
[im 22/27]
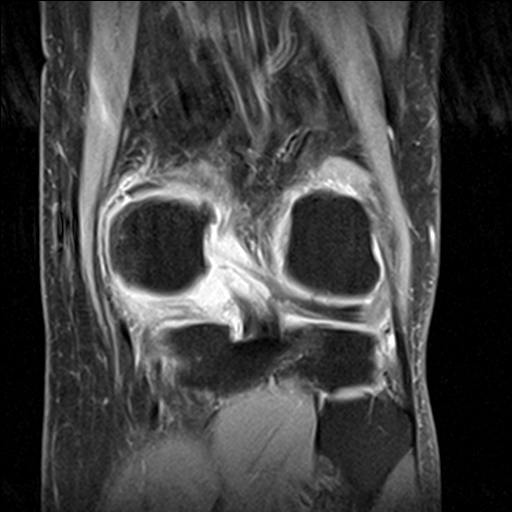

[Series 5: PD fat-sat · sagittal · 3.2mm · 0.33mm/px · 3 of 32 slices shown (3 of 4)]
[im 5/32]
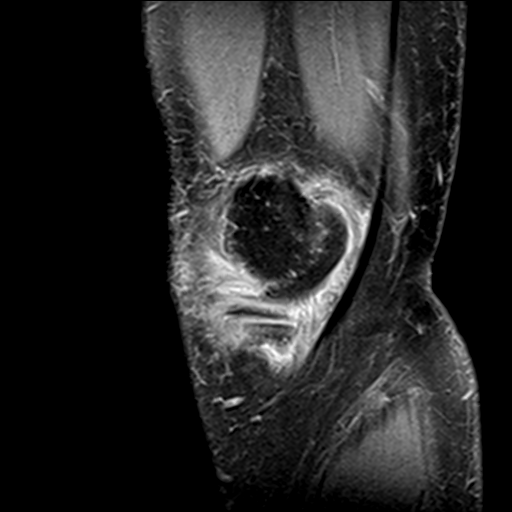
[im 18/32]
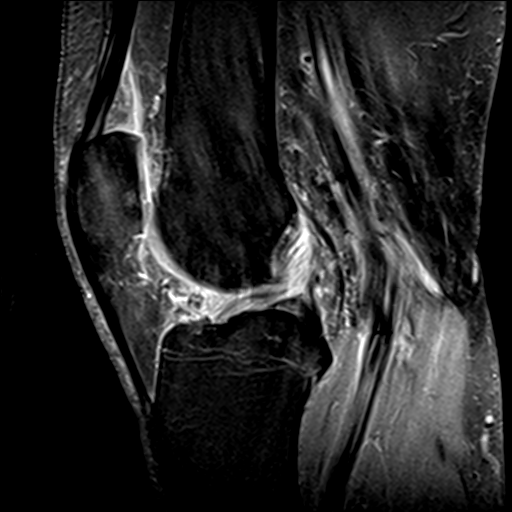
[im 27/32]
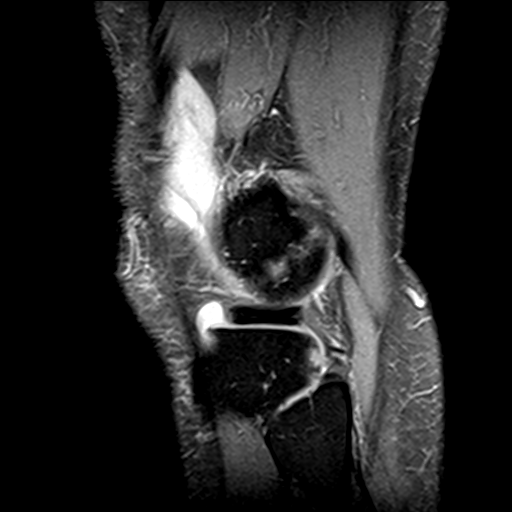

[Series 8: PD fat-sat · coronal · 2.0mm · 0.29mm/px · 3 of 11 slices shown (4 of 4)]
[im 1/11]
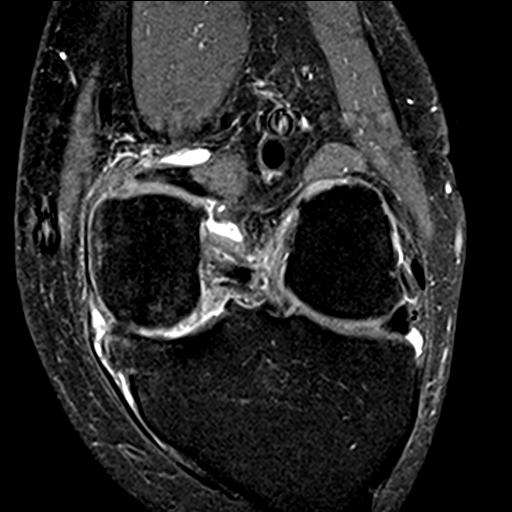
[im 6/11]
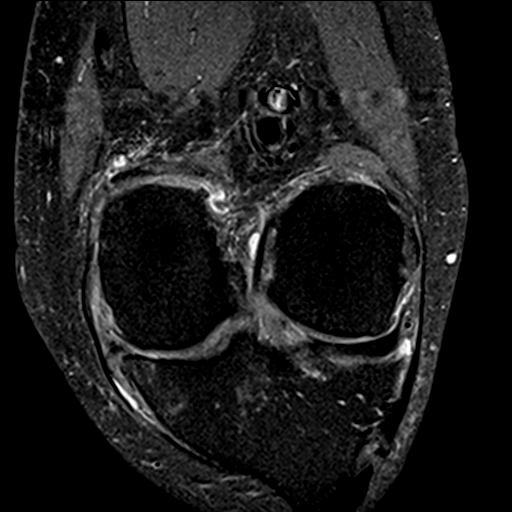
[im 11/11]
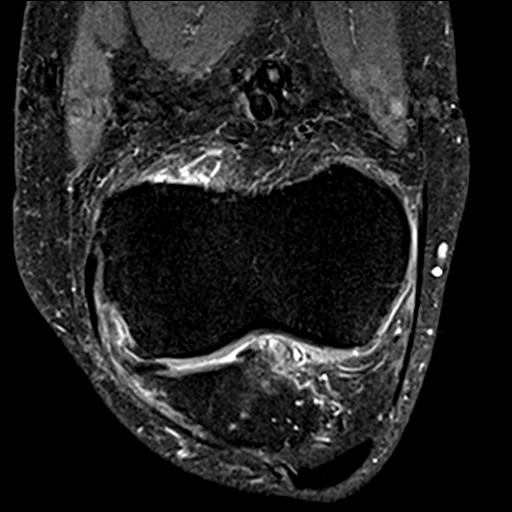

[19 of 40 positions shown; findings below may reference images not displayed]

FINDINGS: MENISCI

Medial meniscus: Severe complex tear of the body and posterior horn
of the medial meniscus.

Lateral meniscus:  Intact.

LIGAMENTS

Cruciates: Intact ACL. ACL is increased in signal and expanded
consistent with degeneration. Intact PCL.

Collaterals: Medial collateral ligament is intact. Lateral
collateral ligament complex is intact.

CARTILAGE

Patellofemoral: High-grade partial-thickness cartilage loss the
lateral patellar facet. Partial-thickness cartilage loss of the
lateral trochlea.

Medial: Extensive full-thickness cartilage loss of medial
femorotibial compartment with subchondral reactive marrow edema.

Lateral: High-grade partial-thickness cartilage loss of the lateral
femorotibial compartment.

Joint: Small joint effusion. Mild edema in Hoffa's fat. No plical
thickening.

Popliteal Fossa:  Small Baker's cyst.  Intact popliteus tendon.

Extensor Mechanism: Intact quadriceps tendon and patellar tendon.
Intact medial and lateral patellar retinaculum. Intact MPFL.

Bones: No other marrow signal abnormality. No fracture or
dislocation.

Other: No fluid collection or hematoma.
IMPRESSION: 1. Severe complex tear of the body and posterior horn of the medial
meniscus.
2. Tricompartmental cartilage abnormalities as described above, most
severe in the medial femorotibial compartment.

## 2019-11-05 ENCOUNTER — Other Ambulatory Visit: Payer: Self-pay | Admitting: Dermatology

## 2020-01-04 ENCOUNTER — Encounter

## 2020-01-04 NOTE — Telephone Encounter (Signed)
Last visit:04/05/19  Next visit:not scheduled  Previous refill 11/15/17(120+11R)    Requested Prescriptions     Pending Prescriptions Disp Refills   ??? cloNIDine HCL (CATAPRES) 0.1 mg tablet 120 Tab 11     Sig: TAKE 2 TABLETS BY MOUTH TWICE DAILY

## 2020-01-05 MED ORDER — CLONIDINE 0.1 MG TAB
0.1 mg | ORAL_TABLET | ORAL | 11 refills | Status: AC
Start: 2020-01-05 — End: ?

## 2020-01-12 ENCOUNTER — Encounter

## 2020-01-14 MED ORDER — CLONIDINE 0.3 MG TAB
0.3 mg | ORAL_TABLET | ORAL | 3 refills | Status: DC
Start: 2020-01-14 — End: 2021-02-09

## 2020-01-14 NOTE — Telephone Encounter (Signed)
Old dosage request. New dosage sent 01/05/20

## 2020-11-14 ENCOUNTER — Encounter: Payer: Self-pay | Admitting: Gastroenterology

## 2020-12-25 ENCOUNTER — Inpatient Hospital Stay
Admit: 2020-12-25 | Discharge: 2020-12-25 | Disposition: A | Discharge status: Home / Self Care | Payer: MEDICARE | Attending: Emergency Medicine

## 2020-12-25 ENCOUNTER — Emergency Department: Admit: 2020-12-25 | Payer: MEDICARE | Primary: Family Medicine

## 2020-12-25 DIAGNOSIS — J189 Pneumonia, unspecified organism: Secondary | ICD-10-CM

## 2020-12-25 LAB — COMPREHENSIVE METABOLIC PANEL
ALT: 34 U/L (ref 12–78)
AST: 38 U/L — ABNORMAL HIGH (ref 15–37)
Albumin/Globulin Ratio: 0.6 — ABNORMAL LOW (ref 1.1–2.2)
Albumin: 2.9 g/dL — ABNORMAL LOW (ref 3.5–5.0)
Alkaline Phosphatase: 93 U/L (ref 45–117)
Anion Gap: 11 mmol/L (ref 5–15)
BUN: 12 MG/DL (ref 6–20)
Bun/Cre Ratio: 14 (ref 12–20)
CO2: 25 mmol/L (ref 21–32)
Calcium: 8.8 MG/DL (ref 8.5–10.1)
Chloride: 101 mmol/L (ref 97–108)
Creatinine: 0.88 MG/DL (ref 0.70–1.30)
EGFR IF NonAfrican American: >60 mL/min/{1.73_m2} (ref >60)
GFR African American: >60 mL/min/{1.73_m2} (ref >60)
Globulin: 4.7 g/dL — ABNORMAL HIGH (ref 2.0–4.0)
Glucose: 83 mg/dL (ref 65–100)
Potassium: 4 mmol/L (ref 3.5–5.1)
Sodium: 137 mmol/L (ref 136–145)
Total Bilirubin: 0.8 MG/DL (ref 0.2–1.0)
Total Protein: 7.6 g/dL (ref 6.4–8.2)

## 2020-12-25 LAB — EKG 12-LEAD
Atrial Rate: 77 {beats}/min
Diagnosis: NORMAL
P Axis: 40 degrees
P-R Interval: 192 ms
Q-T Interval: 374 ms
QRS Duration: 90 ms
QTc Calculation (Bazett): 423 ms
R Axis: -9 degrees
T Axis: 15 degrees
Ventricular Rate: 77 {beats}/min

## 2020-12-25 LAB — CBC WITH AUTO DIFFERENTIAL
Basophils %: 1 % (ref 0–1)
Basophils Absolute: 0 10*3/uL (ref 0.0–0.1)
Eosinophils %: 1 % (ref 0–7)
Eosinophils Absolute: 0.1 10*3/uL (ref 0.0–0.4)
Granulocyte Absolute Count: 0 10*3/uL (ref 0.00–0.04)
Hematocrit: 40.7 % (ref 36.6–50.3)
Hemoglobin: 14 g/dL (ref 12.1–17.0)
Immature Granulocytes: 0 % (ref 0.0–0.5)
Lymphocytes %: 11 % — ABNORMAL LOW (ref 12–49)
Lymphocytes Absolute: 0.9 10*3/uL (ref 0.8–3.5)
MCH: 31.1 PG (ref 26.0–34.0)
MCHC: 34.4 g/dL (ref 30.0–36.5)
MCV: 90.4 FL (ref 80.0–99.0)
MPV: 9.7 FL (ref 8.9–12.9)
Monocytes %: 10 % (ref 5–13)
Monocytes Absolute: 0.8 10*3/uL (ref 0.0–1.0)
NRBC Absolute: 0 10*3/uL (ref 0.00–0.01)
Neutrophils %: 77 % — ABNORMAL HIGH (ref 32–75)
Neutrophils Absolute: 6.5 10*3/uL (ref 1.8–8.0)
Nucleated RBCs: 0 PER 100 WBC
Platelets: 178 10*3/uL (ref 150–400)
RBC: 4.5 M/uL (ref 4.10–5.70)
RDW: 15.1 % — ABNORMAL HIGH (ref 11.5–14.5)
WBC: 8.4 10*3/uL (ref 4.1–11.1)

## 2020-12-25 LAB — TROPONIN, HIGH SENSITIVITY: Troponin, High Sensitivity: 13 ng/L (ref 0–76)

## 2020-12-25 LAB — METABOLIC PANEL, COMPREHENSIVE
A-G Ratio: 0.6 — ABNORMAL LOW (ref 1.1–2.2)
ALT (SGPT): 34 U/L (ref 12–78)
AST (SGOT): 38 U/L — ABNORMAL HIGH (ref 15–37)
Albumin: 2.9 g/dL — ABNORMAL LOW (ref 3.5–5.0)
Alk. phosphatase: 93 U/L (ref 45–117)
Anion gap: 11 mmol/L (ref 5–15)
BUN/Creatinine ratio: 14 (ref 12–20)
BUN: 12 MG/DL (ref 6–20)
Bilirubin, total: 0.8 MG/DL (ref 0.2–1.0)
CO2: 25 mmol/L (ref 21–32)
Calcium: 8.8 MG/DL (ref 8.5–10.1)
Chloride: 101 mmol/L (ref 97–108)
Creatinine: 0.88 MG/DL (ref 0.70–1.30)
GFR est AA: >60 mL/min/{1.73_m2} (ref >60)
GFR est non-AA: >60 mL/min/{1.73_m2} (ref >60)
Globulin: 4.7 g/dL — ABNORMAL HIGH (ref 2.0–4.0)
Glucose: 83 mg/dL (ref 65–100)
Potassium: 4 mmol/L (ref 3.5–5.1)
Protein, total: 7.6 g/dL (ref 6.4–8.2)
Sodium: 137 mmol/L (ref 136–145)

## 2020-12-25 LAB — CBC WITH AUTOMATED DIFF
ABS. BASOPHILS: 0 10*3/uL (ref 0.0–0.1)
ABS. EOSINOPHILS: 0.1 10*3/uL (ref 0.0–0.4)
ABS. IMM. GRANS.: 0 10*3/uL (ref 0.00–0.04)
ABS. LYMPHOCYTES: 0.9 10*3/uL (ref 0.8–3.5)
ABS. MONOCYTES: 0.8 10*3/uL (ref 0.0–1.0)
ABS. NEUTROPHILS: 6.5 10*3/uL (ref 1.8–8.0)
ABSOLUTE NRBC: 0 10*3/uL (ref 0.00–0.01)
BASOPHILS: 1 % (ref 0–1)
EOSINOPHILS: 1 % (ref 0–7)
HCT: 40.7 % (ref 36.6–50.3)
HGB: 14 g/dL (ref 12.1–17.0)
IMMATURE GRANULOCYTES: 0 % (ref 0.0–0.5)
LYMPHOCYTES: 11 % — ABNORMAL LOW (ref 12–49)
MCH: 31.1 PG (ref 26.0–34.0)
MCHC: 34.4 g/dL (ref 30.0–36.5)
MCV: 90.4 FL (ref 80.0–99.0)
MONOCYTES: 10 % (ref 5–13)
MPV: 9.7 FL (ref 8.9–12.9)
NEUTROPHILS: 77 % — ABNORMAL HIGH (ref 32–75)
NRBC: 0 PER 100 WBC
PLATELET: 178 10*3/uL (ref 150–400)
RBC: 4.5 M/uL (ref 4.10–5.70)
RDW: 15.1 % — ABNORMAL HIGH (ref 11.5–14.5)
WBC: 8.4 10*3/uL (ref 4.1–11.1)

## 2020-12-25 LAB — EKG, 12 LEAD, INITIAL
Atrial Rate: 77 {beats}/min
Calculated P Axis: 40 degrees
Calculated R Axis: -9 degrees
Calculated T Axis: 15 degrees
Diagnosis: NORMAL
P-R Interval: 192 ms
Q-T Interval: 374 ms
QRS Duration: 90 ms
QTC Calculation (Bezet): 423 ms
Ventricular Rate: 77 {beats}/min

## 2020-12-25 LAB — TROPONIN-HIGH SENSITIVITY: Troponin-High Sensitivity: 13 ng/L (ref 0–76)

## 2020-12-25 MED ORDER — LEVOFLOXACIN 750 MG TAB
750 mg | ORAL_TABLET | Freq: Every day | ORAL | 0 refills | Status: AC
Start: 2020-12-25 — End: 2020-12-30

## 2020-12-25 MED ORDER — IBUPROFEN 400 MG TAB
400 mg | ORAL | Status: AC
Start: 2020-12-25 — End: 2020-12-25
  Administered 2020-12-25: 12:00:00 via ORAL

## 2020-12-25 MED FILL — IBUPROFEN 400 MG TAB: 400 mg | ORAL | Qty: 2

## 2020-12-25 NOTE — ED Notes (Signed)
Pt presents to ED ambulatory complaining of lower rib pain on the right side, accompanied by a productive cough and shortness of breath that started Tuesday. Pt reports he has been taking tylenol at home for his symptoms. Pt denies being in contact with anyone who has COVID. Pts lung sounds are not clear and a rubbing sound can be heard in both lungs but primarily in the right side. Pt is alert and oriented x 4, skin is warm and dry. Assessment completed and pt updated on plan of care.  Call bell in reach.       Emergency Department Nursing Plan of Care       The Nursing Plan of Care is developed from the Nursing assessment and Emergency Department Attending provider initial evaluation.  The plan of care may be reviewed in the "ED Provider note".    The Plan of Care was developed with the following considerations:   Patient / Family readiness to learn indicated MW:[id number] understanding  Persons(s) to be included in education: patient  Barriers to Learning/Limitations:No    Signed     Rushie Chestnut, RN    12/25/2020   7:53 AM

## 2020-12-25 NOTE — ED Provider Notes (Signed)
ED Provider Notes by Corky Crafts, MD at 12/25/20 [phone]                Author: Corky Crafts, MD  Service: --  Author Type: Physician       Filed: 12/25/20 0916  Date of Service: 12/25/20 0751  Status: Signed          Editor: Corky Crafts, MD (Physician)               EMERGENCY DEPARTMENT HISTORY AND PHYSICAL EXAM           Date: 12/25/2020   Patient Name: Kevin Davenport   Patient Age and Sex: 65 y.o.  male         History of Presenting Illness          Chief Complaint       Patient presents with        ?  Rib Pain        ?  Shortness of Breath           History Provided By: Patient      HPI: Kevin Davenport is a 65 year old  history hypertension presenting with chest pain.  He reports since Monday he has had pain to his right chest.  Pain is present at rest however worse with moving his arms with coughing and with breathing.  It does not radiate, he denies any associated  dyspnea nausea radiation of pain to arms or jaw or diaphoresis.  No leg swelling hemoptysis no personal or family history of VTE, no recent surgery no cancer diagnosis.  No recent coughing illness, no fever.      There are no other complaints, changes, or physical findings at this time.      PCP: Joseph Art, MD        No current facility-administered medications on file prior to encounter.          Current Outpatient Medications on File Prior to Encounter          Medication  Sig  Dispense  Refill           ?  cloNIDine HCL (CATAPRES) 0.1 mg tablet  TAKE 2 TABLETS BY MOUTH TWICE DAILY  120 Tab  11     ?  acetaminophen (TYLENOL) 325 mg tablet  Take 2 Tabs by mouth every six (6) hours as needed for Pain.  50 Tab  1     ?  cloNIDine HCL (CATAPRES) 0.3 mg tablet  TAKE 1 TABLET BY MOUTH TWICE DAILY (Patient not taking: Reported on 12/25/2020)  180 Tab  3           ?  atorvastatin (LIPITOR) 20 mg tablet  Take 1 Tab by mouth daily. For cholesterol (Patient not taking: Reported on 12/25/2020)  90 Tab  3           ?  diclofenac EC (VOLTAREN) 75 mg  EC tablet  Take 1 Tab by mouth two (2) times a day. For knee pain (Patient not taking: Reported on 12/25/2020)  60 Tab  5           ?  aspirin delayed-release 81 mg tablet  Take 1 Tab by mouth daily. (Patient not taking: Reported on 12/25/2020)  30 Tab  12             Past History        Past Medical History:     Past Medical History:  Diagnosis  Date         ?  Arthritis       ?  Other ill-defined conditions(799.89)            Pancreatitis         ?  Pancreatitis             Past Surgical History:     Past Surgical History:         Procedure  Laterality  Date          ?  HX COLONOSCOPY               Family History:     Family History         Problem  Relation  Age of Onset          ?  Hypertension  Mother       ?  Diabetes  Mother       ?  Alzheimer's Disease  Mother            ?  Hypertension  Father             Social History:     Social History          Tobacco Use         ?  Smoking status:  Current Every Day Smoker              Packs/day:  0.25         ?  Smokeless tobacco:  Never Used        ?  Tobacco comment: 1/2 ppd x 18 years       Vaping Use         ?  Vaping Use:  Never used       Substance Use Topics         ?  Alcohol use:  Yes              Alcohol/week:  10.0 standard drinks         Types:  10 Cans of beer per week             Comment: Pt states only drinks on weekends.          ?  Drug use:  No           Allergies:   No Known Allergies           Review of Systems     Review of Systems    Constitutional: Negative for chills and fever.    HENT: Negative for congestion and rhinorrhea.     Respiratory: Negative for shortness of breath.     Cardiovascular: Positive for chest pain.    Gastrointestinal: Negative for abdominal pain, diarrhea, nausea and vomiting.    Genitourinary: Negative for dysuria and frequency.    Musculoskeletal: Negative for myalgias.    Skin: Negative for rash.    Neurological: Negative for weakness and numbness.    All other systems reviewed and are negative.         Physical  Exam     Physical Exam   Vitals and nursing note reviewed.   HENT :       Mouth/Throat:      Mouth: Mucous membranes are moist.   Eyes :       Conjunctiva/sclera: Conjunctivae normal.   Cardiovascular:       Rate and Rhythm: Normal rate and regular rhythm.  No extrasystoles  are present.     Heart sounds: No murmur heard.        Pulmonary:       Effort: Pulmonary effort is normal.      Breath sounds: Examination of the right-middle field reveals rhonchi. Examination of the  right-lower field reveals rhonchi. Rhonchi  present.    Abdominal:      General: Abdomen is flat.     Musculoskeletal:          General: No deformity.    Skin:      General: Skin is warm and dry.   Neurological :       Mental Status: He is alert and oriented to person, place, and time. Mental status is at baseline.    Psychiatric:         Behavior: Behavior normal.         Thought Content: Thought content normal.             Diagnostic Study Results        Labs -         Recent Results (from the past 12 hour(s))     EKG, 12 LEAD, INITIAL          Collection Time: 12/25/20  8:12 AM         Result  Value  Ref Range            Ventricular Rate  77  BPM       Atrial Rate  77  BPM       P-R Interval  192  ms       QRS Duration  90  ms       Q-T Interval  374  ms       QTC Calculation (Bezet)  423  ms       Calculated P Axis  40  degrees       Calculated R Axis  -9  degrees       Calculated T Axis  15  degrees       Diagnosis                 Normal sinus rhythm   Septal infarct (cited on or before 12-Apr-2013)   When compared with ECG of 12-Apr-2013 14:32,   Questionable change in initial forces of Septal leads   Confirmed by Meda Coffee, M.D., [place] [phone]) on 12/25/2020 8:29:17 AM          CBC WITH AUTOMATED DIFF          Collection Time: 12/25/20  8:26 AM         Result  Value  Ref Range            WBC  8.4  4.1 - 11.1 K/uL       RBC  4.50  4.10 - 5.70 M/uL       HGB  14.0  12.1 - 17.0 g/dL       HCT  40.7  36.6 - 50.3 %       MCV  90.4  80.0 - 99.0 FL        MCH  31.1  26.0 - 34.0 PG       MCHC  34.4  30.0 - 36.5 g/dL       RDW  15.1 (H)  11.5 - 14.5 %       PLATELET  178  150 - 400 K/uL       MPV  9.7  8.9 -  12.9 FL       NRBC  0.0  0 PER 100 WBC       ABSOLUTE NRBC  0.00  0.00 - 0.01 K/uL       NEUTROPHILS  77 (H)  32 - 75 %       LYMPHOCYTES  11 (L)  12 - 49 %       MONOCYTES  10  5 - 13 %       EOSINOPHILS  1  0 - 7 %       BASOPHILS  1  0 - 1 %       IMMATURE GRANULOCYTES  0  0.0 - 0.5 %       ABS. NEUTROPHILS  6.5  1.8 - 8.0 K/UL       ABS. LYMPHOCYTES  0.9  0.8 - 3.5 K/UL       ABS. MONOCYTES  0.8  0.0 - 1.0 K/UL       ABS. EOSINOPHILS  0.1  0.0 - 0.4 K/UL       ABS. BASOPHILS  0.0  0.0 - 0.1 K/UL       ABS. IMM. GRANS.  0.0  0.00 - 0.04 K/UL       DF  AUTOMATED          METABOLIC PANEL, COMPREHENSIVE          Collection Time: 12/25/20  8:26 AM         Result  Value  Ref Range            Sodium  137  136 - 145 mmol/L       Potassium  4.0  3.5 - 5.1 mmol/L       Chloride  101  97 - 108 mmol/L       CO2  25  21 - 32 mmol/L       Anion gap  11  5 - 15 mmol/L       Glucose  83  65 - 100 mg/dL       BUN  12  6 - 20 MG/DL       Creatinine  0.88  0.70 - 1.30 MG/DL       BUN/Creatinine ratio  14  12 - 20         GFR est AA  >60  >60 ml/min/1.56m       GFR est non-AA  >60  >60 ml/min/1.753m      Calcium  8.8  8.5 - 10.1 MG/DL       Bilirubin, total  0.8  0.2 - 1.0 MG/DL       ALT (SGPT)  34  12 - 78 U/L       AST (SGOT)  38 (H)  15 - 37 U/L       Alk. phosphatase  93  45 - 117 U/L       Protein, total  7.6  6.4 - 8.2 g/dL       Albumin  2.9 (L)  3.5 - 5.0 g/dL       Globulin  4.7 (H)  2.0 - 4.0 g/dL       A-G Ratio  0.6 (L)  1.1 - 2.2         TROPONIN-HIGH SENSITIVITY          Collection Time: 12/25/20  8:26 AM         Result  Value  Ref Range  Troponin-High Sensitivity  13  0 - 76 ng/L           Radiologic Studies -      XR CHEST PA LAT       Final Result     Mild right basilar patchy airspace disease. Trace right pleural     fluid. No pneumothorax.                       CT Results   (Last 48 hours)          None                 CXR Results   (Last 48 hours)                                    12/25/20 0907    XR CHEST PA LAT  Final result            Impression:    Mild right basilar patchy airspace disease. Trace right pleural      fluid. No pneumothorax.                              Narrative:    Indication:  Chest pain, rib pain, shortness of breath.              Exam: PA and lateral views of the chest.             Direct comparison is made to prior CXR dated January 2014.             Findings: Cardiomediastinal silhouette is within normal limits. There is mild      right basilar patchy airspace disease and trace right pleural fluid. Left lung      is clear. Left pleural space is normal. There is no pneumothorax. While no      displaced rib fracture is visualized, dedicated rib series is not performed.                                       Medical Decision Making     I am the first provider for this patient.      I reviewed the vital signs, available nursing notes, past medical history, past surgical history, family history and social history.      Vital Signs-Reviewed the patient's vital signs.   Patient Vitals for the past 12 hrs:            Temp  Pulse  Resp  BP  SpO2            12/25/20 0727  98.7 ??F (37.1 ??C)  88  20  (!) 169/90  96 %           Records Reviewed: Nursing Notes and Old Medical Records      Provider Notes (Medical Decision Making):    Differential includes pneumonia, pneumothorax, musculoskeletal chest pain      We will obtain EKG troponin to evaluate for ischemia, CBC to evaluate for anemia, CMP to evaluate for organ dysfunction, chest x-ray to evaluate for pneumonia pneumothorax, will give ibuprofen for a high pretest probability of musculoskeletal chest pain  pending work-up      ED Course:    Initial assessment  performed. The patients presenting problems have been discussed, and they are in agreement with the care plan formulated and outlined  with them.  I have encouraged them to ask questions as they arise throughout their visit.        ED Course as of 12/25/20 0916       Thu Dec 25, 2020        0818  EKG demonstrates normal sinus rhythm at a rate of 77, leftward axis, normal intervals.  There is ST elevation in septal leads, baseline wander in aVF.   When compared to EKG last obtained in our system which is in 2014 morphology of septal leads is unchanged with ST elevation noted at the time, given nature of pain and minimal change in morphology from prior EKG will wait on troponin  [WB]     0837  CBC normal counts, neutrophil predominance no bands [WB]     0910  Troponin XIII, will trend [WB]     0911  Chest x-ray confirms right-sided airspace disease in the setting of his cough and lung findings on exam, will treat as acute community-acquired pneumonia.   In setting of this finding which is likely the etiology of his chest pain as well as atypical symptoms we will not trend his troponin [WB]     0916  Will discharge with course of Levaquin, return precautions for worsening chest pain [WB]              ED Course User Index   [WB] Lorenda Cahill, MD        Critical Care Time:    0      Disposition:   Discharge Note:   The patient has been re-evaluated and is ready for discharge. Reviewed available results with patient. Counseled patient on diagnosis and care plan. Patient has expressed understanding, and all questions  have been answered. Patient agrees with plan and agrees to follow up as recommended, or to return to the ED if their symptoms worsen. Discharge instructions have been provided and explained to the patient, along with reasons to return to the ED.        PLAN:     Current Discharge Medication List              START taking these medications          Details        levoFLOXacin (LEVAQUIN) 750 mg tablet  Take 1 Tablet by mouth daily for 5 days.   Qty: 5 Tablet, Refills:  0   Start date: 12/25/2020, End date:  12/30/2020                      2.       Follow-up Information               Follow up With  Specialties  Details  Why  Contact Info              Rica Records, MD  Family Medicine      [place] [address]   [phone]                3.  Return to ED if worse         Diagnosis        Clinical Impression:       1.  Community acquired pneumonia of right middle lobe of lung  Attestations:      Lorenda Cahill, M.D.              Please note that this dictation was completed with Dragon, the computer voice recognition software.  Quite often unanticipated grammatical, syntax, homophones, and other interpretive errors are inadvertently transcribed by the computer software.  Please  disregard these errors.  Please excuse any errors that have escaped final proofreading.  Thank you.

## 2020-12-25 NOTE — ED Notes (Signed)
 Discharge instructions were given to the patient by Rushie Chestnut, RN.     The patient left the Emergency Department ambulatory, alert and oriented and in no acute distress with 1 prescriptions. The patient was encouraged to call or return to the ED for worsening issues or problems and was encouraged to schedule a follow up appointment for continuing care.     The patient verbalized understanding of discharge instructions and prescriptions, all questions were answered. The patient has no further concerns at this time.

## 2021-01-06 ENCOUNTER — Other Ambulatory Visit: Payer: Self-pay

## 2021-01-06 ENCOUNTER — Ambulatory Visit (AMBULATORY_SURGERY_CENTER): Payer: Self-pay

## 2021-01-06 VITALS — Ht 70.0 in | Wt 175.0 lb

## 2021-01-06 DIAGNOSIS — Z86010 Personal history of colonic polyps: Secondary | ICD-10-CM

## 2021-01-06 DIAGNOSIS — Z1211 Encounter for screening for malignant neoplasm of colon: Secondary | ICD-10-CM

## 2021-01-06 MED ORDER — GOLYTELY 236 G PO SOLR: 4000.0000 mL | ORAL | 0 refills | Status: AC

## 2021-01-06 NOTE — Progress Notes (Signed)
No egg or soy allergy known to patient  No issues with past sedation with any surgeries or procedures Patient denies ever being told they had issues or difficulty with intubation  No FH of Malignant Hyperthermia No diet pills per patient No home 02 use per patient  No blood thinners per patient  Pt denies issues with constipation  No A fib or A flutter  COVID 19 guidelines implemented in [facility] today with Pt and RN  Pt is fully vaccinated  for Covid x 2 + booster; NO PA's for preps discussed with pt in [facility] today  Discussed with pt there will be an out-of-pocket cost for prep and that varies from $0 to 70 dollars  Due to the COVID-19 pandemic we are asking patients to follow certain guidelines.  Pt aware of COVID protocols and [facility] guidelines   

## 2021-01-12 ENCOUNTER — Ambulatory Visit: Payer: Self-pay | Admitting: Dermatology

## 2021-01-21 ENCOUNTER — Telehealth: Payer: Self-pay | Admitting: Gastroenterology

## 2021-01-21 DIAGNOSIS — Z86010 Personal history of colonic polyps: Secondary | ICD-10-CM

## 2021-01-21 DIAGNOSIS — Z1211 Encounter for screening for malignant neoplasm of colon: Secondary | ICD-10-CM

## 2021-01-21 MED ORDER — PEG 3350-KCL-NA BICARB-NACL 420 G PO SOLR: 4000.0000 mL | Freq: Once | ORAL | 0 refills | Status: AC

## 2021-01-21 NOTE — Telephone Encounter (Signed)
Pt would like to speak with a nurse. He called stating that he is not feeling well, he has a headache, light fever and sore throat. He is scheduled for a procedure this coming Friday. Pls call him.

## 2021-01-21 NOTE — Telephone Encounter (Signed)
Golytely sent to [facility]

## 2021-01-21 NOTE — Telephone Encounter (Signed)
Inbound call from patient. Asking if prep for procedure 5/13 be sent to [facility] in [facility]. States he was there and they did not have it.

## 2021-01-21 NOTE — Telephone Encounter (Signed)
Patient decided to cancel colonoscopy scheduled for 01/23/21 and will call back when he is feeling better to reschedule.

## 2021-01-23 ENCOUNTER — Encounter: Payer: Self-pay | Admitting: Gastroenterology

## 2021-02-04 ENCOUNTER — Other Ambulatory Visit: Payer: Self-pay

## 2021-02-04 ENCOUNTER — Ambulatory Visit (INDEPENDENT_AMBULATORY_CARE_PROVIDER_SITE_OTHER): Payer: Medicare Other | Admitting: Dermatology

## 2021-02-04 ENCOUNTER — Encounter: Payer: Self-pay | Admitting: Dermatology

## 2021-02-04 DIAGNOSIS — Z1283 Encounter for screening for malignant neoplasm of skin: Secondary | ICD-10-CM | POA: Diagnosis not present

## 2021-02-04 DIAGNOSIS — L821 Other seborrheic keratosis: Secondary | ICD-10-CM

## 2021-02-04 DIAGNOSIS — B001 Herpesviral vesicular dermatitis: Secondary | ICD-10-CM

## 2021-02-04 DIAGNOSIS — L570 Actinic keratosis: Secondary | ICD-10-CM

## 2021-02-04 MED ORDER — VALACYCLOVIR HCL 1 G PO TABS: 1000.0000 mg | ORAL_TABLET | Freq: Two times a day (BID) | ORAL | 0 refills | Status: AC

## 2021-02-08 ENCOUNTER — Encounter

## 2021-02-09 MED ORDER — CLONIDINE 0.3 MG TAB
0.3 mg | ORAL_TABLET | ORAL | 3 refills | Status: DC
Start: 2021-02-09 — End: 2021-02-24

## 2021-02-12 ENCOUNTER — Emergency Department: Admit: 2021-02-12 | Payer: MEDICARE | Primary: Family Medicine

## 2021-02-12 ENCOUNTER — Inpatient Hospital Stay
Admit: 2021-02-12 | Discharge: 2021-02-12 | Disposition: A | Discharge status: Home / Self Care | Payer: MEDICARE | Attending: Emergency Medicine

## 2021-02-12 DIAGNOSIS — L03116 Cellulitis of left lower limb: Secondary | ICD-10-CM

## 2021-02-12 MED ORDER — TRIMETHOPRIM-SULFAMETHOXAZOLE 160 MG-800 MG TAB
160-800 mg | ORAL_TABLET | Freq: Two times a day (BID) | ORAL | 0 refills | Status: AC
Start: 2021-02-12 — End: ?

## 2021-02-12 MED ORDER — OXYCODONE-ACETAMINOPHEN 5 MG-325 MG TAB
5-325 mg | ORAL_TABLET | Freq: Three times a day (TID) | ORAL | 0 refills | Status: AC | PRN
Start: 2021-02-12 — End: 2021-02-15

## 2021-02-12 MED ORDER — OXYCODONE-ACETAMINOPHEN 5 MG-325 MG TAB
5-325 mg | ORAL | Status: AC
Start: 2021-02-12 — End: 2021-02-12
  Administered 2021-02-12: 07:00:00 via ORAL

## 2021-02-12 MED ORDER — INDOMETHACIN 25 MG CAP
25 mg | ORAL | Status: AC
Start: 2021-02-12 — End: 2021-02-12
  Administered 2021-02-12: 07:00:00 via ORAL

## 2021-02-12 MED ORDER — CLOTRIMAZOLE 1 % TOPICAL CREAM
1 % | Freq: Two times a day (BID) | CUTANEOUS | 0 refills | Status: DC
Start: 2021-02-12 — End: 2021-02-15

## 2021-02-12 MED ORDER — COLCHICINE 0.6 MG TAB
0.6 mg | ORAL | Status: AC
Start: 2021-02-12 — End: 2021-02-12
  Administered 2021-02-12: 07:00:00 via ORAL

## 2021-02-12 MED ORDER — IBUPROFEN 800 MG TAB
800 mg | ORAL_TABLET | Freq: Four times a day (QID) | ORAL | 0 refills | Status: AC | PRN
Start: 2021-02-12 — End: 2021-02-19

## 2021-02-12 MED ORDER — PREDNISONE 20 MG TAB
20 mg | ORAL | Status: AC
Start: 2021-02-12 — End: 2021-02-12
  Administered 2021-02-12: 07:00:00 via ORAL

## 2021-02-12 MED FILL — COLCRYS 0.6 MG TABLET: 0.6 mg | ORAL | Qty: 1

## 2021-02-12 MED FILL — INDOMETHACIN 25 MG CAP: 25 mg | ORAL | Qty: 3

## 2021-02-12 MED FILL — PREDNISONE 20 MG TAB: 20 mg | ORAL | Qty: 3

## 2021-02-12 MED FILL — OXYCODONE-ACETAMINOPHEN 5 MG-325 MG TAB: 5-325 mg | ORAL | Qty: 2

## 2021-02-12 NOTE — ED Notes (Signed)
Patient presents to the ED ambulatory with a cane with c/o left foot swelling that started yesterday and got worse today. Denies injury. Denies heart failure, chest pain or SOB. Reports a family hx of gout.

## 2021-02-12 NOTE — ED Provider Notes (Signed)
ED Provider Notes by Mertha Finders, MD at 02/12/21 0225                Author: Mertha Finders, MD  Service: --  Author Type: Physician       Filed: 02/12/21 0400  Date of Service: 02/12/21 0225  Status: Signed          Editor: Mertha Finders, MD (Physician)               65 year old male with a history of arthritis, pancreatitis, high cholesterol, hypertension presents with 2 days  of atraumatic left foot pain at the MTP joints which is worse with walking.  Describes associated redness and swelling of the dorsal surface of his right foot.  Denies ankle or lower leg pain.  Patient states he ate red meat and beer before the pain started.   He does have a family history of gout. Denies fever, nausea, vomiting, dizziness, weakness                  Past Medical History:        Diagnosis  Date         ?  Arthritis       ?  Other ill-defined conditions(799.89)            Pancreatitis         ?  Pancreatitis               Past Surgical History:         Procedure  Laterality  Date          ?  HX COLONOSCOPY                   Family History:         Problem  Relation  Age of Onset          ?  Hypertension  Mother       ?  Diabetes  Mother       ?  Alzheimer's Disease  Mother            ?  Hypertension  Father               Social History          Socioeconomic History         ?  Marital status:  MARRIED              Spouse name:  Not on file         ?  Number of children:  Not on file     ?  Years of education:  Not on file     ?  Highest education level:  Not on file       Occupational History        ?  Not on file       Tobacco Use         ?  Smoking status:  Current Every Day Smoker              Packs/day:  0.25         ?  Smokeless tobacco:  Never Used        ?  Tobacco comment: 1/2 ppd x 18 years       Vaping Use         ?  Vaping Use:  Never used       Substance and Sexual Activity         ?  Alcohol use:  Yes              Alcohol/week:  10.0 standard drinks         Types:  10 Cans of beer per week              Comment: Pt states only drinks on weekends.          ?  Drug use:  No     ?  Sexual activity:  Yes              Partners:  Female        Other Topics  Concern        ?  Not on file       Social History Narrative        ?  Not on file          Social Determinants of Health          Financial Resource Strain:         ?  Difficulty of Paying Living Expenses: Not on file       Food Insecurity:         ?  Worried About Running Out of Food in the Last Year: Not on file     ?  Ran Out of Food in the Last Year: Not on file       Transportation Needs:         ?  Lack of Transportation (Medical): Not on file     ?  Lack of Transportation (Non-Medical): Not on file       Physical Activity:         ?  Days of Exercise per Week: Not on file     ?  Minutes of Exercise per Session: Not on file       Stress:         ?  Feeling of Stress : Not on file       Social Connections:         ?  Frequency of Communication with Friends and Family: Not on file     ?  Frequency of Social Gatherings with Friends and Family: Not on file     ?  Attends Religious Services: Not on file     ?  Active Member of Clubs or Organizations: Not on file     ?  Attends Banker Meetings: Not on file     ?  Marital Status: Not on file       Intimate Partner Violence:         ?  Fear of Current or Ex-Partner: Not on file     ?  Emotionally Abused: Not on file     ?  Physically Abused: Not on file     ?  Sexually Abused: Not on file       Housing Stability:         ?  Unable to Pay for Housing in the Last Year: Not on file     ?  Number of Places Lived in the Last Year: Not on file        ?  Unstable Housing in the Last Year: Not on file              ALLERGIES: Patient has no known allergies.      Review of Systems    Constitutional: Negative.  Negative for fever.    HENT: Negative.  Negative for drooling, facial swelling and trouble swallowing.     Eyes: Negative.  Negative for discharge and redness.    Respiratory: Negative.  Negative for chest  tightness, shortness of breath and wheezing.     Cardiovascular: Negative.  Negative for chest pain.    Gastrointestinal: Negative.  Negative for abdominal distention, abdominal pain, constipation, diarrhea, nausea and vomiting.    Endocrine: Negative.     Genitourinary: Negative.  Negative for difficulty urinating and dysuria.    Musculoskeletal: Positive for arthralgias and joint swelling . Negative for myalgias.    Skin: Positive for rash. Negative for color change.    Allergic/Immunologic: Negative.     Neurological: Negative.  Negative for syncope, facial asymmetry and speech difficulty.    Hematological: Negative.     Psychiatric/Behavioral: Negative.  Negative for agitation and confusion.    All other systems reviewed and are negative.           Vitals:          02/12/21 0215        BP:  (!) 133/92     Pulse:  95     Resp:  16     Temp:  97.5 ??F (36.4 ??C)     SpO2:  96%     Weight:  85.7 kg (189 lb)        Height:  5\' 9"  (1.753 m)                Physical Exam   Vitals and nursing note reviewed.   Constitutional:        Appearance: Normal appearance. He is well-developed.   HENT :       Head: Normocephalic and atraumatic.   Eyes :       Conjunctiva/sclera: Conjunctivae normal.   Cardiovascular:       Rate and Rhythm: Normal rate and regular rhythm.   Pulmonary :       Effort: No accessory muscle usage or respiratory distress.   Abdominal:      Palpations: Abdomen is soft.      Tenderness: There is no abdominal  tenderness.     Musculoskeletal:          General: Normal range of motion.      Cervical back: Neck supple.        Feet:       Feet:       Comments: Erythema and edema as diagrammed. Macerated skin in interdigital web spaces between great toe and 2nd toe, with erythema and superficial fissures.  Some maceration of skin between  4th and fifth toe, dorsal surface which is tender and adjacent to the greatest area of redness.  Tenderness to palpation 2nd-5th mtp joint.  No ulcer      Skin:      General:  Skin is warm and dry.   Neurological :       Mental Status: He is alert and oriented to person, place, and time.    Psychiatric:         Behavior: Behavior normal.         Thought Content: Thought content normal.              MDM   Number of Diagnoses or Management Options   Cellulitis of left lower extremity   Inflammatory arthritis   Left foot pain   Tinea pedis of left foot   Diagnosis management comments: 65 year old male with atraumatic pain and erythema to dorsal  surface of left foot at second through fifth MCP joints.  No fever or systemic symptoms. No h/o diabetes.  Differential diagnosis: Inflammatory arthritis, cellulitis, occult fracture. Seems to have underlying athlete' foot which may have been nidus for infection.         ED Course as of 02/12/21 0320       Thu Feb 12, 2021        [id number]  X-ray results discussed with patient. Pleasantly states "I just want to go home and go to sleep."  [SS]              ED Course User Index   [SS] Mertha FindersSimons, Orville Mena, MD           Procedures      LABORATORY TESTS:   No results found for this or any previous visit (from the past 12 hour(s)).      IMAGING RESULTS:     XR FOOT LT MIN 3 V       Final Result     No acute abnormality. First MTP joint degenerative change.                  MEDICATIONS GIVEN:     Medications       indomethacin (INDOCIN) capsule 75 mg (75 mg Oral Given 02/12/21 0235)     predniSONE (DELTASONE) tablet 60 mg (60 mg Oral Given 02/12/21 0235)     colchicine tablet 0.6 mg (0.6 mg Oral Given 02/12/21 0235)       oxyCODONE-acetaminophen (PERCOCET) 5-325 mg per tablet 2 Tablet (2 Tablets Oral Given 02/12/21 0235)           IMPRESSION:      1.  Cellulitis of left lower extremity      2.  Tinea pedis of left foot      3.  Inflammatory arthritis         4.  Left foot pain            PLAN:   1.      Discharge Medication List as of 02/12/2021  2:58 AM              START taking these medications          Details        clotrimazole (LOTRIMIN) 1 % topical cream  Apply  to  affected area two (2) times a day for 30 days. Apply twice a day for 2-4 weeks, Normal, Disp-60 g, R-0               trimethoprim-sulfamethoxazole (Bactrim DS) 160-800 mg per tablet  Take 2 Tablets by mouth two (2) times a day., Normal, Disp-40 Tablet, R-0               ibuprofen (MOTRIN) 800 mg tablet  Take 1 Tablet by mouth every six (6) hours as needed for Pain for up to 7 days., Normal, Disp-20 Tablet, R-0               oxyCODONE-acetaminophen (Percocet) 5-325 mg per tablet  Take 1 Tablet by mouth every eight (8) hours as needed for Pain for up to 3 days. Max Daily Amount: 3 Tablets. Indications: pain, Normal, Disp-10 Tablet,  R-0                     CONTINUE these medications which have NOT CHANGED          Details        !!  cloNIDine HCL (CATAPRES) 0.3 mg tablet  TAKE 1 TABLET BY MOUTH TWICE DAILY, Normal, Disp-180 Tablet, R-3               !! cloNIDine HCL (CATAPRES) 0.1 mg tablet  TAKE 2 TABLETS BY MOUTH TWICE DAILY, Normal, Disp-120 Tab, R-11               atorvastatin (LIPITOR) 20 mg tablet  Take 1 Tab by mouth daily. For cholesterol, Normal, Disp-90 Tab, R-3               diclofenac EC (VOLTAREN) 75 mg EC tablet  Take 1 Tab by mouth two (2) times a day. For knee pain, Normal, Disp-60 Tab, R-5               acetaminophen (TYLENOL) 325 mg tablet  Take 2 Tabs by mouth every six (6) hours as needed for Pain., No Print, Disp-50 Tab, R-1               aspirin delayed-release 81 mg tablet  Take 1 Tab by mouth daily., No Print, Disp-30 Tab, R-12               !! - Potential duplicate medications found. Please discuss with provider.               2.      Follow-up Information               Follow up With  Specialties  Details  Why  Contact Info              Joseph Art, MD  Family Medicine      [address] [place] [address]   [phone]                 Natasha Mead, DPM  Podiatry  Schedule an appointment as soon as possible for a visit     [address]   [address] [place] [address]    [phone]                 [facility] EMERGENCY DEPT  Emergency Medicine    As needed, If symptoms worsen  [address]   [place] [address]   [phone]             Return to ED if worse

## 2021-02-12 NOTE — ED Notes (Signed)
..  Discharge summary and discharge medications reviewed with patient and appropriate educational materials and side effects teaching were provided.  patient  Given 0 paper prescriptions and 4 electronic prescriptions sent to pt's listed pharmacy.Patient (s) verbalized understanding of the importance of discussing medications with his or her physician or clinic they will be following up with. No si/s of acute distress prior to discharge.Patient offered wheelchair from treatment area to hospital entrance, patient declined wheelchair.

## 2021-02-12 NOTE — ED Notes (Signed)
 Pt reports swelling of left foot over course of 2X days. Pt has small skin breakdown between toes from moisture and swelling of foot. Pt has good pulses and sensations.    Emergency Department Nursing Plan of Care       The Nursing Plan of Care is developed from the Nursing assessment and Emergency Department Attending provider initial evaluation.  The plan of care may be reviewed in the "ED Provider note".    The Plan of Care was developed with the following considerations:   Patient / Family readiness to learn indicated ab:czmajopszi understanding  Persons(s) to be included in education: patient  Barriers to Learning/Limitations:No    Signed     Donnice Carolus, RN    02/12/2021   2:20 AM

## 2021-02-15 ENCOUNTER — Inpatient Hospital Stay
Admit: 2021-02-15 | Discharge: 2021-02-15 | Disposition: A | Discharge status: Home / Self Care | Payer: MEDICARE | Attending: Emergency Medicine

## 2021-02-15 DIAGNOSIS — B353 Tinea pedis: Secondary | ICD-10-CM

## 2021-02-15 MED ORDER — TERBINAFINE 250 MG TAB
250 mg | ORAL_TABLET | Freq: Every day | ORAL | 0 refills | Status: AC
Start: 2021-02-15 — End: ?

## 2021-02-15 NOTE — ED Notes (Signed)
Discharge instructions were given to the patient by Kyleigh Bell, RN.     The patient left the Emergency Department ambulatory, alert and oriented and in no acute distress with one prescriptions. The patient was encouraged to call or return to the ED for worsening issues or problems and was encouraged to schedule a follow up appointment for continuing care.     The patient verbalized understanding of discharge instructions and prescriptions, all questions were answered. The patient has no further concerns at this time.

## 2021-02-15 NOTE — ED Notes (Signed)
 Pt presents ambulatory to ED with C/O wound to left foot related to athletes foot he has had for some time now. Pt reports a blister recently popped and is now draining. Green drainage noted to wound. Foot is swollen and red.       Emergency Department Nursing Plan of Care       The Nursing Plan of Care is developed from the Nursing assessment and Emergency Department Attending provider initial evaluation.  The plan of care may be reviewed in the "ED Provider note".    The Plan of Care was developed with the following considerations:   Patient / Family readiness to learn indicated ab:czmajopszi understanding  Persons(s) to be included in education: patient  Barriers to Learning/Limitations:No    Signed     Shan Edison, RN    02/15/2021   4:45 PM

## 2021-02-15 NOTE — ED Provider Notes (Signed)
ED Provider Notes by Costella Hatcher, MD at 02/15/21 1545                Author: Costella Hatcher, MD  Service: Emergency Medicine  Author Type: Physician       Filed: 02/15/21 1615  Date of Service: 02/15/21 1545  Status: Signed          Editor: Costella Hatcher, MD (Physician)               EMERGENCY DEPARTMENT HISTORY AND PHYSICAL EXAM           Date: 02/15/2021   Patient Name: Kevin Davenport        History of Presenting Illness          Chief Complaint       Patient presents with        ?  Skin Infection        History Provided By: Patient      HPI: Kevin Davenport,  65 y.o. male with past medical history significant for pancreatitis and osteoarthritis who  presents to the ED with cc of skin infection between the toes of his left foot.  Patient was seen 3 days ago and was diagnosed with tinea pedis as well as cellulitis.  He has been using the clotrimazole cream and taking the antibiotics as prescribed without  improvement.  He has also been soaking his feet in Epson salt.  He states now the wounds have become more purulent and his tissue is starting to slough.  He does not keep his feet dry but states after he washes his feet he immediately puts the antifungal  ointment on it.  He denies any fevers or chills.      PMHx: Pancreatitis and osteoarthritis   Social Hx: Smokes 1/2 pack/day, occasional alcohol use, denies illegal drug use      PCP: Joseph Art, MD      There are no other complaints, changes, or physical findings at this time.        No current facility-administered medications on file prior to encounter.          Current Outpatient Medications on File Prior to Encounter          Medication  Sig  Dispense  Refill           ?  trimethoprim-sulfamethoxazole (Bactrim DS) 160-800 mg per tablet  Take 2 Tablets by mouth two (2) times a day.  40 Tablet  0     ?  ibuprofen (MOTRIN) 800 mg tablet  Take 1 Tablet by mouth every six (6) hours as needed for Pain for up to 7 days.  20 Tablet  0            ?  oxyCODONE-acetaminophen (Percocet) 5-325 mg per tablet  Take 1 Tablet by mouth every eight (8) hours as needed for Pain for up to 3 days. Max Daily Amount: 3 Tablets. Indications: pain  10 Tablet  0           ?  [DISCONTINUED] clotrimazole (LOTRIMIN) 1 % topical cream  Apply  to affected area two (2) times a day for 30 days. Apply twice a day for 2-4 weeks  60 g  0     ?  cloNIDine HCL (CATAPRES) 0.3 mg tablet  TAKE 1 TABLET BY MOUTH TWICE DAILY  180 Tablet  3     ?  cloNIDine HCL (CATAPRES) 0.1 mg tablet  TAKE 2 TABLETS BY  MOUTH TWICE DAILY (Patient not taking: Reported on 02/12/2021)  120 Tab  11     ?  atorvastatin (LIPITOR) 20 mg tablet  Take 1 Tab by mouth daily. For cholesterol (Patient not taking: Reported on 12/25/2020)  90 Tab  3     ?  diclofenac EC (VOLTAREN) 75 mg EC tablet  Take 1 Tab by mouth two (2) times a day. For knee pain (Patient not taking: Reported on 12/25/2020)  60 Tab  5     ?  acetaminophen (TYLENOL) 325 mg tablet  Take 2 Tabs by mouth every six (6) hours as needed for Pain. (Patient not taking: Reported on 02/12/2021)  50 Tab  1           ?  aspirin delayed-release 81 mg tablet  Take 1 Tab by mouth daily. (Patient not taking: Reported on 12/25/2020)  30 Tab  12          Past History        Past Medical History:     Past Medical History:        Diagnosis  Date         ?  Arthritis       ?  Other ill-defined conditions(799.89)            Pancreatitis         ?  Pancreatitis          Past Surgical History:     Past Surgical History:         Procedure  Laterality  Date          ?  HX COLONOSCOPY            Family History:     Family History         Problem  Relation  Age of Onset          ?  Hypertension  Mother       ?  Diabetes  Mother       ?  Alzheimer's Disease  Mother            ?  Hypertension  Father          Social History:     Social History          Tobacco Use         ?  Smoking status:  Current Every Day Smoker              Packs/day:  0.25         ?  Smokeless tobacco:   Never Used        ?  Tobacco comment: 1/2 ppd x 18 years       Vaping Use         ?  Vaping Use:  Never used       Substance Use Topics         ?  Alcohol use:  Yes              Alcohol/week:  10.0 standard drinks         Types:  10 Cans of beer per week             Comment: Pt states only drinks on weekends.          ?  Drug use:  No        Allergies:   No Known Allergies     Review of Systems  Review of Systems    Constitutional: Negative for chills and fever.    HENT: Negative for congestion, rhinorrhea, sneezing and sore throat.     Eyes: Negative for redness and visual disturbance.    Respiratory: Negative for shortness of breath.     Cardiovascular: Negative for chest pain and leg swelling.    Gastrointestinal: Negative for abdominal pain, nausea and vomiting.    Genitourinary: Negative for difficulty urinating and frequency.    Musculoskeletal: Positive for joint swelling (left foot) . Negative for back pain, myalgias and neck stiffness.    Skin: Positive for wound. Negative for rash.    Neurological: Negative for dizziness, syncope, weakness and headaches.    Hematological: Negative for adenopathy.    All other systems reviewed and are negative.        Physical Exam     Physical Exam   Vitals and nursing note reviewed.   Constitutional:        Appearance: Normal appearance. He is well-developed.   HENT :       Head: Normocephalic and atraumatic.   Cardiovascular :       Rate and Rhythm: Normal rate and regular rhythm.      Pulses: Normal pulses.      Heart sounds: Normal heart sounds.    Pulmonary:       Effort: Pulmonary effort is normal. No respiratory distress.      Breath sounds: Normal breath sounds.   Chest:       Chest wall: No tenderness.   Abdominal :      General: Bowel sounds are normal.      Palpations: Abdomen is soft.      Tenderness: There is no abdominal tenderness. There is no guarding or rebound.     Musculoskeletal:      Cervical back: Full passive range of motion without pain, normal  range of motion and neck supple.    Feet:       Comments: Erythema and edema of the distal left foot. Macerated skin between the second and third toe as well as the fourth and fifth toe of the left foot with purulent drainage between the  fourth and fifth toe.  Tender to palpation over the left second through fifth MTP joints.      Skin:      General: Skin is warm and dry.      Findings: Wound (Left foot)  present. No erythema or rash.   Neurological :       Mental Status: He is alert and oriented to person, place, and time.    Psychiatric:         Speech: Speech normal.         Behavior: Behavior normal.         Thought Content: Thought content normal.         Judgment: Judgment normal.                            Diagnostic Study Results     Labs -    No results found for this or any previous visit (from the past 12 hour(s)).      Radiologic Studies -      No orders to display        No results found.     Medical Decision Making     I am the first provider for this patient.      I  reviewed the vital signs, available nursing notes, past medical history, past surgical history, family history and social history.      Vital Signs-Reviewed the patient's vital signs.   Patient Vitals for the past 24 hrs:            Temp  Pulse  Resp  BP  SpO2            02/15/21 1530  98.1 ??F (36.7 ??C)  68  16  (!) 157/101  100 %        Pulse Oximetry Analysis - 100% on RA (normal)     Records Reviewed: Nursing Notes, Old Medical Records and Previous Radiology Studies      Provider Notes (Medical Decision Making):    65 year old male presents with worsening tinea pedis of his left foot.  Since he is failing the clotrimazole, will discontinue that and start him on terbinafine daily for 2 weeks and refer him to outpatient  podiatry.      ED Course:    Initial assessment performed. The patients presenting problems have been discussed, and they are in agreement with the care plan formulated and outlined with them.  I have encouraged them to  ask questions as they arise throughout their visit.      Progress Note:    Updated pt on all returned results and findings. Discussed the importance of proper follow up as referred below along with return precautions. Pt in agreement with the care plan and expresses agreement with and understanding of all items discussed.      Disposition:   Discharge Note:   The pt is ready for discharge. The pt's signs, symptoms, diagnosis, and discharge instructions have been discussed and pt has conveyed their understanding. The pt is to follow up as recommended or return to ER should their symptoms worsen. Plan has been  discussed and pt is in agreement.      PLAN:   1.      Current Discharge Medication List              START taking these medications          Details        terbinafine HCL (LAMISIL) 250 mg tablet  Take 1 Tablet by mouth daily.   Qty: 14 Tablet, Refills:  0   Start date: 02/15/2021                      2.      Follow-up Information               Follow up With  Specialties  Details  Why  Contact Info              Joseph Art, MD  [facility] Medicine  Schedule an appointment as soon as possible for a visit     [address] [place] [address]   [phone]                 Natasha Mead, DPM  Podiatry  Schedule an appointment as soon as possible for a visit     [address]   [address] [place] [address]   [phone]                 [facility] [place] [facility] EMERGENCY DEPT  Emergency Medicine    As needed, If symptoms worsen  [address]   [place] [address]   [phone]  Return to ED if worse         Diagnosis        Clinical Impression:       1.  Tinea pedis of left foot         2.  Cellulitis of toe of left foot                    Please note that this dictation was completed with Dragon, Advertising account planner.  Quite often unanticipated grammatical, syntax, homophones, and other interpretive  errors are inadvertently transcribed by the computer software.  Please disregard  these errors.  Additionally, please excuse any errors that have escaped final proofreading.

## 2021-02-17 ENCOUNTER — Encounter: Payer: Self-pay | Admitting: Dermatology

## 2021-02-17 NOTE — Progress Notes (Signed)
   Follow-Up Visit   Subjective  Mitchell Burns is a 64 y.o. male who presents for the following: Annual Exam (No new concerns).  General skin examination Location:  Duration:  Quality:  Associated Signs/Symptoms: Modifying Factors:  Severity:  Timing: Context:   Objective  Well appearing patient in no apparent distress; mood and affect are within normal limits. Objective  Left Abdomen (side) - Upper: General skin examination, no atypical pigmented lesions, no new or recurrent nonmelanoma skin cancer.  Objective  Left Buccal Cheek: Gritty 4 mm crust pink  Objective  Mid Back: Brown textured flattopped 6 mm papule  Objective  Mid Lower Vermilion Lip: History of recurrent HSV lower lip    A full examination was performed including scalp, head, eyes, ears, nose, lips, neck, chest, axillae, abdomen, back, buttocks, bilateral upper extremities, bilateral lower extremities, hands, feet, fingers, toes, fingernails, and toenails. All findings within normal limits unless otherwise noted below.   Assessment & Plan    Encounter for screening for malignant neoplasm of skin Left Abdomen (side) - Upper  Annual skin examination  AK (actinic keratosis) Left Buccal Cheek  Destruction of lesion - Left Buccal Cheek Complexity: simple   Destruction method: cryotherapy   Informed consent: discussed and consent obtained   Timeout:  patient name, date of birth, surgical site, and procedure verified Lesion destroyed using liquid nitrogen: Yes   Cryotherapy cycles:  3 Outcome: patient tolerated procedure well with no complications    Seborrheic keratosis Mid Back  Leave if stable  Fever blister Mid Lower Vermilion Lip  Valacyclovir 1 g as soon as possible for recurrence  Ordered Medications: valACYclovir (VALTREX) 1000 MG tablet      I, Lavonna Monarch, MD, have reviewed all documentation for this visit.  The documentation on 02/17/21 for the exam, diagnosis,  procedures, and orders are all accurate and complete.

## 2021-02-24 ENCOUNTER — Encounter

## 2021-02-24 MED ORDER — CLONIDINE 0.3 MG TAB
0.3 mg | ORAL_TABLET | Freq: Two times a day (BID) | ORAL | 3 refills | Status: DC
Start: 2021-02-24 — End: 2021-02-26

## 2021-02-24 NOTE — Telephone Encounter (Signed)
 Last visit:04/05/19  Next visit:not scheduled  Previous refill: 02/09/21(180+    Requested Prescriptions     Pending Prescriptions Disp Refills   . cloNIDine HCL (CATAPRES) 0.3 mg tablet 180 Tablet 3     Sig: Take 1 Tablet by mouth two (2) times a day.     For Pharmacy Admin Tracking Only    . Intervention Detail: Discontinued Rx: 1, reason: Duplicate Therapy and New Rx: 1, reason: Patient Preference  . Time Spent (min): 5    Duplicate fax sent.

## 2021-02-25 NOTE — Telephone Encounter (Signed)
 Duplicate sent for patients clonidine 0.3mg     For Pharmacy Admin Tracking Only    . Intervention Detail: Discontinued Rx: 1, reason: Duplicate Therapy  . Time Spent (min): 5

## 2021-02-26 ENCOUNTER — Encounter

## 2021-02-26 MED ORDER — CLONIDINE 0.3 MG TAB
0.3 mg | ORAL_TABLET | Freq: Two times a day (BID) | ORAL | 3 refills | Status: AC
Start: 2021-02-26 — End: ?

## 2021-02-26 NOTE — Telephone Encounter (Signed)
 Duplicate message regarding patient's clonidine was faxed over. However this fax is in regards to [facility] not previous pharmacy it was sent to.     Last visit:04/05/19  Next visit: not scheduled  Previous refill 02/24/21(180+0R) medication sent to wrong pharmacy    Requested Prescriptions     Pending Prescriptions Disp Refills   . cloNIDine HCL (CATAPRES) 0.3 mg tablet 180 Tablet 3     Sig: Take 1 Tablet by mouth two (2) times a day.         For Pharmacy Admin Tracking Only    . Intervention Detail: New Rx: 1, reason: Patient Preference  . Time Spent (min): 5

## 2022-04-20 ENCOUNTER — Encounter

## 2022-05-05 ENCOUNTER — Ambulatory Visit: Payer: MEDICARE | Primary: Family Medicine

## 2022-06-11 ENCOUNTER — Inpatient Hospital Stay: Admit: 2022-06-11 | Discharge: 2022-06-11 | Disposition: A | Discharge status: Home / Self Care | Payer: MEDICARE

## 2022-06-11 DIAGNOSIS — S51812A Laceration without foreign body of left forearm, initial encounter: Secondary | ICD-10-CM

## 2022-06-11 MED ORDER — BACITRACIN 500 UNIT/GM EX OINT
500 UNIT/GM | CUTANEOUS | Status: AC
Start: 2022-06-11 — End: 2022-06-11
  Administered 2022-06-11: 17:00:00 1 via TOPICAL

## 2022-06-11 MED ORDER — LIDOCAINE HCL (PF) 1 % IJ SOLN
1 % | Freq: Once | INTRAMUSCULAR | Status: AC
Start: 2022-06-11 — End: 2022-06-11
  Administered 2022-06-11: 17:00:00 5 mL via SUBCUTANEOUS

## 2022-06-11 MED FILL — BACITRACIN 500 UNIT/GM EX OINT: 500 UNIT/GM | CUTANEOUS | Qty: 1

## 2022-06-11 MED FILL — XYLOCAINE-MPF 1 % IJ SOLN: 1 % | INTRAMUSCULAR | Qty: 5

## 2022-06-11 NOTE — ED Triage Notes (Signed)
Pt reports accidentally cutting his left wrist with a sander at work today. Last tetanus unknown

## 2022-06-11 NOTE — ED Notes (Signed)
Pt presents ambulatory to ED complaining of a laceration to his left wrist that is about one inch long. Pt reports he cut it on a sanding machine at work today. Bleeding in controlled upon arrival. Pt states he has not had a tetanus shot in the past two years. Pt is alert and oriented x 4, RR even and unlabored, skin is warm and dry. Assesment completed and pt updated on plan of care.       Emergency Department Nursing Plan of Care       The Nursing Plan of Care is developed from the Nursing assessment and Emergency Department Attending provider initial evaluation.  The plan of care may be reviewed in the "ED Provider note".      Signed     Cashtyn Pouliot Janann Colonel, RN    06/11/2022   12:45 PM      Evonnie Dawes, RN  06/11/22 1247

## 2022-06-11 NOTE — ED Provider Notes (Signed)
[facility] EMERGENCY DEPT  EMERGENCY DEPARTMENT ENCOUNTER         Pt Name: Kevin Davenport  MRN: [id number]  Birthdate 06/17/1956  Date of evaluation: 06/11/2022  Provider: Erskin Burnet, PA-C   PCP: Joseph Art, MD  Note Started: 1:10 PM EDT 06/11/22     CHIEF COMPLAINT       Chief Complaint   Patient presents with    Laceration        HISTORY OF PRESENT ILLNESS: 1 or more elements      History From: Patient  HPI Limitations: None     Kevin Davenport is a 66 y.o. male who presents with laceration to the left forearm that occurred while sanding something at work.  Last tetanus 1 year ago.  Patient denies any other injury.  Rates discomfort 5 out of 10.     Nursing Notes were all reviewed and agreed with or any disagreements were addressed in the HPI.     REVIEW OF SYSTEMS      Review of Systems     Positives and Pertinent negatives as per HPI.    PAST HISTORY     Past Medical History:  Past Medical History:   Diagnosis Date    Arthritis     Other ill-defined conditions(799.89)     Pancreatitis    Pancreatitis        Past Surgical History:  Past Surgical History:   Procedure Laterality Date    COLONOSCOPY         Family History:  Family History   Problem Relation Age of Onset    Hypertension Father     Alzheimer's Disease Mother     Hypertension Mother     Diabetes Mother        Social History:  Social History     Tobacco Use    Smoking status: Every Day     Packs/day: .25     Types: Cigarettes    Smokeless tobacco: Never    Tobacco comments:     Quit smoking: 1/2 ppd x 18 years   Substance Use Topics    Alcohol use: Yes     Alcohol/week: 10.0 standard drinks of alcohol    Drug use: No       Allergies:  No Known Allergies    CURRENT MEDICATIONS      Previous Medications    ACETAMINOPHEN (TYLENOL) 325 MG TABLET    Take 650 mg by mouth every 6 hours as needed    ASPIRIN 81 MG EC TABLET    Take 81 mg by mouth daily    ATORVASTATIN (LIPITOR) 20 MG TABLET    Take 20 mg by mouth daily    CLONIDINE (CATAPRES) 0.1 MG TABLET    TAKE  2 TABLETS BY MOUTH TWICE DAILY    CLONIDINE (CATAPRES) 0.3 MG TABLET    Take 0.3 mg by mouth 2 times daily    DICLOFENAC (VOLTAREN) 75 MG EC TABLET    Take 75 mg by mouth 2 times daily    SULFAMETHOXAZOLE-TRIMETHOPRIM (BACTRIM DS;SEPTRA DS) 800-160 MG PER TABLET    Take 2 tablets by mouth 2 times daily    TERBINAFINE (LAMISIL) 250 MG TABLET    Take 250 mg by mouth daily       PHYSICAL EXAM      ED Triage Vitals [06/11/22 1232]   Enc Vitals Group      BP (!) 174/80      Pulse 62  Respirations 16      Temp 97.9 F (36.6 C)      Temp Source Oral      SpO2 100 %      Weight - Scale 190 lb (86.2 kg)      Height 5\' 8"  (1.727 m)      Head Circumference       Peak Flow       Pain Score       Pain Loc       Pain Edu?       Excl. in [place]?         Physical Exam  Constitutional:       General: He is not in acute distress.  HENT:      Head: Normocephalic and atraumatic.   Eyes:      Conjunctiva/sclera: Conjunctivae normal.   Cardiovascular:      Rate and Rhythm: Normal rate and regular rhythm.   Pulmonary:      Effort: Pulmonary effort is normal.      Breath sounds: Normal breath sounds.   Musculoskeletal:         General: Signs of injury present.      Left forearm: Laceration (superficial laceration to the L forearm) present.        Arms:    Skin:     General: Skin is warm and dry.   Neurological:      Mental Status: He is alert and oriented to person, place, and time.   Psychiatric:         Mood and Affect: Mood normal.         Behavior: Behavior normal.          DIAGNOSTIC RESULTS   LABS:     No results found for this or any previous visit (from the past 24 hour(s)).       PROCEDURES   Unless otherwise noted below, none  Lac Repair    Date/Time: 06/11/2022 1:00 PM    Performed by: Chester Holstein, PA-C  Authorized by: Chester Holstein, PA-C    Consent:     Consent obtained:  Verbal    Consent given by:  Patient  Universal protocol:     Patient identity confirmed:  Provided demographic data  Anesthesia:     Anesthesia  method:  Local infiltration    Local anesthetic:  Lidocaine 1% w/o epi  Laceration details:     Location:  Shoulder/arm    Shoulder/arm location:  L lower arm    Length (cm):  1  Pre-procedure details:     Preparation:  Patient was prepped and draped in usual sterile fashion  Treatment:     Area cleansed with:  Povidone-iodine    Amount of cleaning:  Standard    Irrigation solution:  Sterile saline    Irrigation method:  Syringe    Visualized foreign bodies/material removed: no      Debridement:  None    Undermining:  None  Skin repair:     Repair method:  Sutures    Suture size:  4-0    Suture material:  Nylon    Suture technique:  Simple interrupted    Number of sutures:  3  Approximation:     Approximation:  Close  Repair type:     Repair type:  Simple  Post-procedure details:     Dressing:  Antibiotic ointment and adhesive bandage    Procedure completion:  Tolerated well, no immediate complications  EMERGENCY DEPARTMENT COURSE and DIFFERENTIAL DIAGNOSIS/MDM   Vitals:    Vitals:    06/11/22 1232   BP: (!) 174/80   Pulse: 62   Resp: 16   Temp: 97.9 F (36.6 C)   TempSrc: Oral   SpO2: 100%   Weight: 86.2 kg (190 lb)   Height: 1.727 m (5\' 8" )        Patient was given the following medications:  Medications   lidocaine PF 1 % injection 5 mL (5 mLs SubCUTAneous Given by Other 06/11/22 1252)   bacitracin ointment (1 each Topical Given 06/11/22 1314)       CONSULTS: (Who and What was discussed)  None    Chronic Conditions:  has a past medical history of Arthritis, Other ill-defined conditions(799.89), and Pancreatitis.      Social Determinants affecting Dx or Tx: None    Records Reviewed (source and summary of external records): Nursing Notes and Old Medical Records    MDM: CC/HPI Summary, DDx, ED Course, and Reassessment, Disposition Considerations (Tests not done, Shared Decision Making, Pt Expectation of Test or Tx.):    Patient presents to the ED with laceration to the left forearm that occurred while at work  and sanding something.  He has no other injuries, no significant PMH and is not on any anticoagulants.  On exam he has a superficial laceration to the distal forearm that will need suture repair.  Tetanus is up-to-date so no need for tetanus booster.  See procedure note for details             FINAL IMPRESSION     1. Laceration of left forearm, initial encounter          DISPOSITION/PLAN   DISPOSITION Decision To Discharge 06/11/2022 01:12:29 PM        Care plan outlined and precautions discussed.  Patient has no new complaints, changes, or physical findings.   All medications were reviewed with the patient; will d/c home. All of pt's questions and concerns were addressed. Patient was instructed and agrees to follow up with PCP as needed, as well as to return to the ED upon further deterioration. Patient is ready to go home.      PATIENT REFERRED TO:  Joseph ArtMichael Sheehan, MD  [address]  [place] [id number]  [phone]    In 1 week  As needed, For suture removal    [facility] EMERGENCY DEPT  [address]  [place] [id number]  [phone]  In 1 week  For suture removal 7-10 days       DISCHARGE MEDICATIONS:     Medication List        ASK your doctor about these medications      acetaminophen 325 MG tablet  Commonly known as: TYLENOL     aspirin 81 MG EC tablet     atorvastatin 20 MG tablet  Commonly known as: LIPITOR     * cloNIDine 0.1 MG tablet  Commonly known as: CATAPRES     * cloNIDine 0.3 MG tablet  Commonly known as: CATAPRES     diclofenac 75 MG EC tablet  Commonly known as: VOLTAREN     sulfamethoxazole-trimethoprim 800-160 MG per tablet  Commonly known as: BACTRIM DS;SEPTRA DS     terbinafine 250 MG tablet  Commonly known as: LAMISIL           * This list has 2 medication(s) that are the same as other medications prescribed for you. Read the directions  carefully, and ask your doctor or other care provider to review them with you.                    DISCONTINUED MEDICATIONS:  Current Discharge  Medication List          I have seen and evaluated the patient autonomously. My supervision physician was on site and available for consultation if needed.     I am the Primary Clinician of Record.   Erskin Burnet, PA-C (electronically signed)    (Please note that parts of this dictation were completed with voice recognition software. Quite often unanticipated grammatical, syntax, homophones, and other interpretive errors are inadvertently transcribed by the computer software. Please disregards these errors. Please excuse any errors that have escaped final proofreading.)     Erskin Burnet, PA-C  06/11/22 1323

## 2022-06-11 NOTE — ED Notes (Signed)
Pt wound dressed with nonadherant dressing and coban  Discharge instructions were given to the patient by [facility], RN.     The patient left the Emergency Department ambulatory, alert and oriented and in no acute distress with 0 prescriptions. The patient was encouraged to call or return to the ED for worsening issues or problems and was encouraged to schedule a follow up appointment for continuing care.     The patient verbalized understanding of discharge instructions and prescriptions, all questions were answered. The patient has no further concerns at this time.        Evonnie Dawes, RN  06/11/22 1326

## 2022-06-20 ENCOUNTER — Inpatient Hospital Stay: Admit: 2022-06-20 | Discharge: 2022-06-20 | Disposition: A | Discharge status: Home / Self Care | Payer: MEDICARE

## 2022-06-20 DIAGNOSIS — Z4802 Encounter for removal of sutures: Secondary | ICD-10-CM

## 2022-06-20 NOTE — ED Provider Notes (Signed)
[facility] EMERGENCY DEPT  EMERGENCY DEPARTMENT ENCOUNTER       Pt Name: Kevin Davenport  MRN: [id number]  [place] September 02, 1956  Date of evaluation: 06/20/2022  Provider: Jeneen Rinks, APRN - NP   PCP: Rica Records, MD  Note Started: 2:26 PM 06/20/22     CHIEF COMPLAINT       Chief Complaint   Patient presents with    Suture / Staple Removal        HISTORY OF PRESENT ILLNESS: 1 or more elements      History Provided by: Patient   History is limited by: Nothing     Kevin Davenport is a 66 y.o. male who presents [facility] emergency department.  Patient states he is here for suture removal.  States he had a laceration on his left forearm on September 29 when standing at work.  States he had 3 sutures placed.  Denies drainage swelling or pain.     Nursing Notes were all reviewed and agreed with or any disagreements were addressed in the HPI.     REVIEW OF SYSTEMS      Review of Systems   Constitutional:  Negative for fever.   HENT:  Negative for congestion.    Eyes:  Negative for visual disturbance.   Respiratory:  Negative for shortness of breath.    Cardiovascular:  Negative for chest pain.   Gastrointestinal:  Negative for abdominal pain.   Genitourinary:  Negative for difficulty urinating.   Musculoskeletal:  Negative for back pain and neck pain.   Skin:  Positive for wound. Negative for rash.   Neurological:  Negative for dizziness, weakness and headaches.   Psychiatric/Behavioral:  Negative for behavioral problems.    All other systems reviewed and are negative.       Positives and Pertinent negatives as per HPI.    PAST HISTORY     Past Medical History:  Past Medical History:   Diagnosis Date    Arthritis     Other ill-defined conditions(799.89)     Pancreatitis    Pancreatitis        Past Surgical History:  Past Surgical History:   Procedure Laterality Date    COLONOSCOPY         Family History:  Family History   Problem Relation Age of Onset    Hypertension Father     Alzheimer's Disease Mother     Hypertension Mother      Diabetes Mother        Social History:  Social History     Tobacco Use    Smoking status: Every Day     Packs/day: .25     Types: Cigarettes    Smokeless tobacco: Never    Tobacco comments:     Quit smoking: 1/2 ppd x 18 years   Substance Use Topics    Alcohol use: Yes     Alcohol/week: 10.0 standard drinks of alcohol    Drug use: No       Allergies:  No Known Allergies    CURRENT MEDICATIONS      Discharge Medication List as of 06/20/2022 11:03 AM        CONTINUE these medications which have NOT CHANGED    Details   acetaminophen (TYLENOL) 325 MG tablet Take 650 mg by mouth every 6 hours as neededHistorical Med      aspirin 81 MG EC tablet Take 81 mg by mouth dailyHistorical Med      atorvastatin (LIPITOR) 20 MG tablet Take  20 mg by mouth dailyHistorical Med      !! cloNIDine (CATAPRES) 0.1 MG tablet TAKE 2 TABLETS BY MOUTH TWICE DAILYHistorical Med      !! cloNIDine (CATAPRES) 0.3 MG tablet Take 0.3 mg by mouth 2 times dailyHistorical Med      diclofenac (VOLTAREN) 75 MG EC tablet Take 75 mg by mouth 2 times dailyHistorical Med      sulfamethoxazole-trimethoprim (BACTRIM DS;SEPTRA DS) 800-160 MG per tablet Take 2 tablets by mouth 2 times dailyHistorical Med      terbinafine (LAMISIL) 250 MG tablet Take 250 mg by mouth dailyHistorical Med       !! - Potential duplicate medications found. Please discuss with provider.          PHYSICAL EXAM      Vitals:    06/20/22 1025   BP: (!) 152/77   Pulse: 68   Resp: 18   Temp: 98.2 F (36.8 C)   TempSrc: Oral   SpO2: 99%   Weight: 86.2 kg (190 lb)   Height: 1.727 m (5\' 8" )       Physical Exam  Vitals and nursing note reviewed.   Constitutional:       Appearance: Normal appearance.   HENT:      Head: Normocephalic.      Right Ear: External ear normal.      Left Ear: External ear normal.      Nose: Nose normal.      Mouth/Throat:      Mouth: Mucous membranes are moist.   Eyes:      Pupils: Pupils are equal, round, and reactive to light.   Cardiovascular:      Rate and  Rhythm: Normal rate and regular rhythm.   Pulmonary:      Effort: Pulmonary effort is normal.      Breath sounds: Normal breath sounds.   Abdominal:      Palpations: Abdomen is soft.   Musculoskeletal:         General: Normal range of motion.      Cervical back: Normal range of motion.   Skin:     General: Skin is warm and dry.      Comments: Left forearm well-healing incision 3 sutures in place no erythema no drainage no swelling   Neurological:      General: No focal deficit present.      Mental Status: He is alert and oriented to person, place, and time.   Psychiatric:         Mood and Affect: Mood normal.          DIAGNOSTIC RESULTS   LABS:     No results found for this or any previous visit (from the past 12 hour(s)).    EKG: When ordered, EKG's are interpreted by the Emergency Department Physician in the absence of a cardiologist.  Please see their note for interpretation of EKG.      RADIOLOGY:  Non-plain film images such as CT, Ultrasound and MRI are read by the radiologist. Plain radiographic images are visualized and preliminarily interpreted by the ED Provider with the below findings:          Interpretation per the Radiologist below, if available at the time of this note:     No orders to display        PROCEDURES   Unless otherwise noted below, none  Suture Removal    Date/Time: 06/20/2022 9:55 AM    Performed by: 08/20/2022,  APRN - NP  Authorized by: Ellsworth Lennox, APRN - NP    Universal protocol:     Procedure explained and questions answered to patient or proxy's satisfaction: yes      Immediately prior to procedure, a time out was called: yes      Patient identity confirmed:  Verbally with patient  Location:     Location: Left forearm.  Procedure details:     Wound appearance:  No signs of infection, good wound healing and clean    Number of sutures removed:  3  Post-procedure details:     Post-removal:  Band-Aid applied    Procedure completion:  Tolerated well, no immediate  complications       CRITICAL CARE TIME       EMERGENCY DEPARTMENT COURSE and DIFFERENTIAL DIAGNOSIS/MDM   Vitals:    Vitals:    06/20/22 1025   BP: (!) 152/77   Pulse: 68   Resp: 18   Temp: 98.2 F (36.8 C)   TempSrc: Oral   SpO2: 99%   Weight: 86.2 kg (190 lb)   Height: 1.727 m (5\' 8" )        Patient was given the following medications:  Medications - No data to display    CONSULTS: (Who and What was discussed)  None    Chronic Conditions: arthritis    ADDITIONAL CONSIDERATIONS:  None    RECORDS REVIEWED: Nursing Notes    CC/HPI Summary, DDx, ED Course, and Reassessment: 66 year old male presents for suture removal had 3 sutures placed left forearm on September 29 after laceration from standing at work physical exam well-healing incision 3 sutures intact no surrounding erythema no drainage signs of infection will remove sutures advised patient to continue to apply Neosporin.         Disposition Considerations (Tests not done, Shared Decision Making, Pt Expectation of Test or Tx.):      FINAL IMPRESSION     1. Visit for suture removal          DISPOSITION/PLAN   DISPOSITION Decision To Discharge 06/20/2022 10:53:40 AM      Discharge Note: The patient is stable for discharge home. The signs, symptoms, diagnosis, and discharge instructions have been discussed, understanding conveyed, and agreed upon. The patient is to follow up as recommended or return to ER should their symptoms worsen.      PATIENT REFERRED TO:  08/20/2022, MD  [address] [place]  [phone]    In 1 week  As needed       DISCHARGE MEDICATIONS:     Medication List        ASK your doctor about these medications      acetaminophen 325 MG tablet  Commonly known as: TYLENOL     aspirin 81 MG EC tablet     atorvastatin 20 MG tablet  Commonly known as: LIPITOR     * cloNIDine 0.1 MG tablet  Commonly known as: CATAPRES     * cloNIDine 0.3 MG tablet  Commonly known as: CATAPRES     diclofenac 75 MG EC tablet  Commonly known as:  VOLTAREN     sulfamethoxazole-trimethoprim 800-160 MG per tablet  Commonly known as: BACTRIM DS;SEPTRA DS     terbinafine 250 MG tablet  Commonly known as: LAMISIL           * This list has 2 medication(s) that are the same as other medications prescribed for you. Read the directions carefully,  and ask your doctor or other care provider to review them with you.                    DISCONTINUED MEDICATIONS:  Discharge Medication List as of 06/20/2022 11:03 AM          I have seen and evaluated the patient autonomously. My supervision physician was on site and available for consultation if needed.     I am the Primary Clinician of Record.   Clayborn Bigness, APRN - NP (electronically signed)    (Please note that parts of this dictation were completed with voice recognition software. Quite often unanticipated grammatical, syntax, homophones, and other interpretive errors are inadvertently transcribed by the computer software. Please disregards these errors. Please excuse any errors that have escaped final proofreading.)         Ellsworth Lennox, APRN - NP  06/20/22 1429

## 2022-06-20 NOTE — ED Notes (Signed)
Discharge instructions given to patient by NP and RN. Pt has been given counseling regarding at home treatment plan. Pt verbalizes understanding of need to seek further treatment if symptoms worsen. Pt ambulated off of unit in no signs of distress.       Janine Limbo, RN  06/20/22 1056

## 2022-09-23 ENCOUNTER — Inpatient Hospital Stay: Admit: 2022-09-23 | Discharge: 2022-09-23 | Payer: MEDICARE

## 2022-09-23 DIAGNOSIS — Z5329 Procedure and treatment not carried out because of patient's decision for other reasons: Secondary | ICD-10-CM

## 2022-09-23 NOTE — ED Notes (Signed)
Pt called multiple times to treatment area no response

## 2022-09-23 NOTE — ED Triage Notes (Signed)
Pt ambulatory to triage for c/o intermittent right flank pain since yesterday. Denies N/V/D. Denies urinary sx.

## 2022-09-23 NOTE — ED Notes (Signed)
Pt called to treatment area no response x1

## 2022-09-23 NOTE — ED Notes (Signed)
Called pt to room x2 with no response.

## 2023-05-17 ENCOUNTER — Emergency Department: Admit: 2023-05-17 | Payer: MEDICARE | Primary: Family Medicine

## 2023-05-17 ENCOUNTER — Inpatient Hospital Stay
Admit: 2023-05-17 | Discharge: 2023-05-17 | Disposition: A | Discharge status: Home / Self Care | Payer: MEDICARE | Attending: Emergency Medicine

## 2023-05-17 DIAGNOSIS — M10061 Idiopathic gout, right knee: Secondary | ICD-10-CM

## 2023-05-17 DIAGNOSIS — M109 Gout, unspecified: Secondary | ICD-10-CM

## 2023-05-17 MED ORDER — OXYCODONE-ACETAMINOPHEN 5-325 MG PO TABS
5-325 | ORAL_TABLET | Freq: Four times a day (QID) | ORAL | 0 refills | Status: AC | PRN
Start: 2023-05-17 — End: 2023-05-22

## 2023-05-17 MED ORDER — PREDNISONE 20 MG PO TABS
20 | ORAL_TABLET | Freq: Every day | ORAL | 0 refills | Status: AC
Start: 2023-05-17 — End: 2023-05-24

## 2023-05-17 MED ORDER — OXYCODONE-ACETAMINOPHEN 5-325 MG PO TABS
5-325 | ORAL | Status: AC
Start: 2023-05-17 — End: 2023-05-17

## 2023-05-17 MED ORDER — PREDNISONE 20 MG PO TABS
20 | ORAL | Status: AC
Start: 2023-05-17 — End: 2023-05-17

## 2023-05-17 MED ORDER — KETOROLAC TROMETHAMINE 30 MG/ML IJ SOLN
30 | INTRAMUSCULAR | Status: AC
Start: 2023-05-17 — End: 2023-05-17

## 2023-05-17 MED ORDER — IBUPROFEN 600 MG PO TABS
600 | ORAL_TABLET | Freq: Four times a day (QID) | ORAL | 1 refills | Status: AC | PRN
Start: 2023-05-17 — End: 2023-05-27

## 2023-05-17 MED ADMIN — ketorolac (TORADOL) injection 30 mg: 30 mg | INTRAMUSCULAR | @ 13:00:00 | NDC 63323016200

## 2023-05-17 MED ADMIN — predniSONE (DELTASONE) tablet 40 mg: 40 mg | ORAL | @ 13:00:00 | NDC 60687014511

## 2023-05-17 MED ADMIN — oxyCODONE-acetaminophen (PERCOCET) 5-325 MG per tablet 2 tablet: 2 | ORAL | @ 13:00:00 | NDC 00406051223

## 2023-05-17 MED FILL — PREDNISONE 20 MG PO TABS: 20 MG | ORAL | Qty: 2

## 2023-05-17 MED FILL — OXYCODONE-ACETAMINOPHEN 5-325 MG PO TABS: 5-325 MG | ORAL | Qty: 2

## 2023-05-17 MED FILL — KETOROLAC TROMETHAMINE 30 MG/ML IJ SOLN: 30 MG/ML | INTRAMUSCULAR | Qty: 1

## 2023-05-17 NOTE — Discharge Instructions (Signed)
 I suspect that your pain and swelling of the right knee are due to gout, which is caused by small crystals of uric acid that depositing joints and cause a strong inflammatory reaction.    I would recommend that you take prednisone  40 mg daily for 1 week, as well as extra strength ibuprofen  2-3 times a day, and Percocet  as needed for severe pain.    It was a pleasure taking care of you at [facility] Emergency Department today.  We know that when you come to [facility], you are entrusting us  with your health, comfort, and safety.  Our physicians and nurses honor that trust, and we appreciate the opportunity to care for you and your loved ones.  We also value your feedback, and we would like to hear from you.    If you receive a  >>> survey <<< about your Emergency Department experience today, please fill it out. We review every single response from our patients. Thank you!

## 2023-05-17 NOTE — ED Notes (Signed)
 Patient has been instructed that they have been given percocet which contains opioids, benzodiazepines, or other sedating drugs. Patient is aware that they will need to refrain from driving or operating heavy machinery after taking this medication. Patient also instructed that they need to avoid drinking alcohol and using other products containing opioids, benzodiazepines, or other sedating drugs. Patient verbalized understanding.

## 2023-05-17 NOTE — ED Provider Notes (Signed)
 EMERGENCY DEPARTMENT HISTORY AND PHYSICAL EXAM    Date: 05/17/2023  Patient Name: Kevin Davenport  Patient Age and Sex: 67 y.o. male  MRN:  [id number]  CSN:  [id number]    History of Present Illness     Chief Complaint   Patient presents with    Leg Pain       History Provided By: Patient    Ability to gather history was limited by:     HPI: Kevin Davenport, 67 y.o. male   With no significant past medical history complains of intermittent long-term pain in the right knee, recently flaring up over the last few days, with some swelling around the right knee especially at the superior aspect of the knee.  No fevers.  No falls or injuries.  He reports a family history of gout.      Tobacco Use      Smoking status: Every Day        Packs/day: 0.25        Types: Cigarettes      Smokeless tobacco: Never      Tobacco comments: Quit smoking: 1/2 ppd x 18 years     Past History   The patient's medical, surgical, and social history were reviewed by me today.    Current Medications:  No current facility-administered medications on file prior to encounter.     Current Outpatient Medications on File Prior to Encounter   Medication Sig Dispense Refill    cloNIDine (CATAPRES) 0.1 MG tablet TAKE 2 TABLETS BY MOUTH TWICE DAILY      acetaminophen  (TYLENOL ) 325 MG tablet Take 2 tablets by mouth every 6 hours as needed      aspirin 81 MG EC tablet Take 1 tablet by mouth daily      atorvastatin (LIPITOR) 20 MG tablet Take 1 tablet by mouth daily      cloNIDine (CATAPRES) 0.3 MG tablet Take 1 tablet by mouth 2 times daily      diclofenac (VOLTAREN) 75 MG EC tablet Take 1 tablet by mouth 2 times daily      sulfamethoxazole-trimethoprim (BACTRIM DS;SEPTRA DS) 800-160 MG per tablet Take 2 tablets by mouth 2 times daily      terbinafine (LAMISIL) 250 MG tablet Take 1 tablet by mouth daily         Past Medical History:   Diagnosis Date    Arthritis     Other ill-defined conditions(799.89)     Pancreatitis    Pancreatitis        Past Surgical  History:   Procedure Laterality Date    COLONOSCOPY         Social History     Tobacco Use    Smoking status: Every Day     Current packs/day: 0.25     Types: Cigarettes    Smokeless tobacco: Never    Tobacco comments:     Quit smoking: 1/2 ppd x 18 years   Substance Use Topics    Alcohol use: Not Currently     Alcohol/week: 10.0 standard drinks of alcohol     Types: 10 Standard drinks or equivalent per week    Drug use: No       Allergies:  No Known Allergies  Review of Systems   A Review of Systems was reviewed by me today during this encounter.  Pertinent positive and negative elements are noted in the HPI and MDM sections.    Review of Systems   Constitutional:  Negative for fever.  Musculoskeletal:  Positive for joint swelling.   All other systems reviewed and are negative.      Physical Exam   Vital Signs  Patient Vitals for the past 8 hrs:   Temp Pulse Resp BP SpO2   05/17/23 0819 98.1 F (36.7 C) 66 14 (!) 149/91 98 %          Physical Exam  Vitals reviewed.   Constitutional:       General: He is not in acute distress.     Appearance: He is not ill-appearing.   Musculoskeletal:      Right knee: Swelling and effusion present. No erythema. Normal range of motion. Tenderness present.        Legs:       Comments:  Moderate effusion of the right knee, with minimal tenderness.  No erythema or induration.   Skin:     General: Skin is warm.      Findings: No erythema.         Diagnostic Study Results   Labs  No results found for this visit on 05/17/23.   ==============================================================    Radiologic Studies  XR KNEE RIGHT (3 VIEWS)   Final Result   No acute abnormality. Prominent DJD.      Electronically signed by CY KIDNEY, MD             Critical Care and Billable Procedures   EKG reviewed by ED Physician Thornell in the absence of a cardiologist: Yes  EKG below was independently interpreted by me Alverda Thornell, MD)    Procedures    Medical Decision Making   I reviewed the  patient's most recent Emergency Dept notes and diagnostic tests in formulating my MDM on today's visit.    Medications administered during ED course:  Medications   ketorolac  (TORADOL ) injection 30 mg (30 mg IntraMUSCular Given 05/17/23 0907)   oxyCODONE -acetaminophen  (PERCOCET ) 5-325 MG per tablet 2 tablet (2 tablets Oral Given 05/17/23 0905)   predniSONE  (DELTASONE ) tablet 40 mg (40 mg Oral Given 05/17/23 9094)     Medical Decision Making // ED Course // Reassessment:  Kevin Davenport, 67 y.o. male With no significant past medical history complains of intermittent long-term pain in the right knee, recently flaring up over the last few days, with some swelling around the right knee especially at the superior aspect of the knee.  No fevers.  No falls or injuries.  He reports a family history of gout.    On examination he has a moderate palpable effusion of the right knee, with minimal tenderness, and no erythema or induration.    No clinical concern for septic arthritis or cellulitis.  There does not appear to be an infectious process.    Examination is consistent with most likely gout vs reactive effusion from osteoarthritis.    I prescribed prednisone , NSAIDs, Percocet  as needed for more severe pain.    I considered diagnostic arthrocentesis but given the absence of concern for septic arthritis I have deferred this for now.      Radiology results independently interpreted by me: Signs of osteoarthritis, with somewhat ragged bone contours, no fractures.  No foreign bodies.      Final Diagnosis:   1. Acute gout of right knee, unspecified cause    2. Effusion of right knee    3. Primary osteoarthritis of right knee        Additional documentation if relevant for this encounter     Diagnosis and Disposition  Disposition: Decision To Discharge 05/17/2023 09:05:02 AM     Final Diagnosis:   1. Acute gout of right knee, unspecified cause    2. Effusion of right knee    3. Primary osteoarthritis of right knee            Medication List        START taking these medications      ibuprofen  600 MG tablet  Commonly known as: ADVIL ;MOTRIN   Take 1 tablet by mouth every 6 hours as needed for Pain     oxyCODONE -acetaminophen  5-325 MG per tablet  Commonly known as: Percocet   Take 1 tablet by mouth every 6 hours as needed for Pain for up to 5 days. Intended supply: 3 days. Take lowest dose possible to manage pain Max Daily Amount: 4 tablets     predniSONE  20 MG tablet  Commonly known as: DELTASONE   Take 2 tablets by mouth daily for 7 days            ASK your doctor about these medications      acetaminophen  325 MG tablet  Commonly known as: TYLENOL      aspirin 81 MG EC tablet     atorvastatin 20 MG tablet  Commonly known as: LIPITOR     * cloNIDine 0.1 MG tablet  Commonly known as: CATAPRES     * cloNIDine 0.3 MG tablet  Commonly known as: CATAPRES     diclofenac 75 MG EC tablet  Commonly known as: VOLTAREN     sulfamethoxazole-trimethoprim 800-160 MG per tablet  Commonly known as: BACTRIM DS;SEPTRA DS     terbinafine 250 MG tablet  Commonly known as: LAMISIL           * This list has 2 medication(s) that are the same as other medications prescribed for you. Read the directions carefully, and ask your doctor or other care provider to review them with you.                   Where to Get Your Medications        These medications were sent to [facility] [address], [place] - [address] - [place] [phone] [place] [phone]  [address] [place], [place] [address]      Phone: [phone]   ibuprofen  600 MG tablet  oxyCODONE -acetaminophen  5-325 MG per tablet  predniSONE  20 MG tablet          Follow up:  [facility] EMERGENCY DEPT  [address] [place]  [address]  [phone]           Disclaimers   I was the first provider for this patient on this visit.  To the best of my ability I reviewed relevant prior medical records, electrocardiograms, laboratories, and radiologic studies.    The patient's presenting problems were  discussed, and the patient was in agreement with the care plan formulated and outlined with them.     Please note that this dictation was completed with Dragon voice recognition software. Quite often unanticipated grammatical, syntax, homophones, and other interpretive errors are inadvertently transcribed by the computer software.   Please disregard these errors and excuse any errors that have escaped final proofreading.    Vicenta Jaeger, MD    I personally performed the services described in this documentation on this date 05/17/23  for patient Kevin Davenport.      Vicenta Jaeger, MD  4:17 PM  Thornell Berg, MD  05/17/23 [phone]

## 2023-05-17 NOTE — ED Notes (Signed)
 Pt presents ambulatory to ED complaining of right leg pain since October. Pt reports pain and swelling in the right leg since getting his prostate removed in October. BP elevated on arrival. Pt took last dose of clonidine last night for HTN. Pt is alert and oriented x 4, RR even and unlabored, skin is warm and dry. Assesment completed and pt updated on plan of care.       Emergency Department Nursing Plan of Care       The Nursing Plan of Care is developed from the Nursing assessment and Emergency Department Attending provider initial evaluation.  The plan of care may be reviewed in the "ED Provider note".    The Plan of Care was developed with the following considerations:   Patient / Family readiness to learn indicated ab:czmajopszi understanding  Persons(s) to be included in education: patient  Barriers to Learning/Limitations:None    Signed     Adina Leeds, RN    05/17/2023   8:24 AM

## 2023-05-17 NOTE — ED Notes (Signed)
 Patient discharged home by provider. Patient received 3 prescriptions. Patient ambulated out of ED appearing in NAD.

## 2023-08-30 ENCOUNTER — Encounter: Admit: 2023-08-30 | Admitting: Family

## 2023-08-30 DIAGNOSIS — Z136 Encounter for screening for cardiovascular disorders: Secondary | ICD-10-CM

## 2023-09-16 ENCOUNTER — Encounter: Admit: 2023-09-16 | Admitting: Family

## 2023-09-16 DIAGNOSIS — Z136 Encounter for screening for cardiovascular disorders: Secondary | ICD-10-CM

## 2023-09-21 ENCOUNTER — Inpatient Hospital Stay: Admit: 2023-09-21 | Payer: MEDICARE | Primary: Family Medicine

## 2023-09-21 DIAGNOSIS — Z136 Encounter for screening for cardiovascular disorders: Secondary | ICD-10-CM

## 2023-10-28 LAB — VAS AAA SCREENING
Ao dist AP: 1.7 cm
Ao dist TR: 1.7 cm
Ao mid AP: 1.9 cm
Ao mid TR: 1.9 cm
Ao prox AP: 2 cm
Ao prox TR: 2 cm
Left CIA AP: 1 cm
Left CIA TR: 1 cm
Right CIA AP: 1 cm
Right CIA TR: 1 cm

## 2024-07-31 ENCOUNTER — Emergency Department: Admit: 2024-07-31 | Payer: Medicare (Managed Care) | Primary: Family Medicine

## 2024-07-31 ENCOUNTER — Inpatient Hospital Stay
Admit: 2024-07-31 | Discharge: 2024-08-01 | Disposition: A | Discharge status: Home / Self Care | Payer: Medicare (Managed Care) | Arrived: VH | Attending: Emergency Medicine

## 2024-07-31 DIAGNOSIS — L03031 Cellulitis of right toe: Principal | ICD-10-CM

## 2024-07-31 MED ORDER — LIDO-EPINEPHRINE-TETRACAINE 4-0.18-0.5 % EX GEL
4-0.18-0.5 | Freq: Once | CUTANEOUS | Status: AC
Start: 2024-07-31 — End: 2024-07-31
  Administered 2024-07-31: 23:00:00 3 mL via TOPICAL

## 2024-07-31 MED FILL — STERILE TOPICAL L.E.T. GEL 4-0.18-0.5 % EX GEL: 4-0.18-0.5 % | CUTANEOUS | Qty: 3 | Fill #0

## 2024-07-31 NOTE — Discharge Instructions (Addendum)
"  You are seen the emergency department for your symptoms.  Please take antibiotics as prescribed.  Follow-up with podiatrist and return to ER in 2 days for wound recheck.  Return sooner for new or worsening symptoms anytime including fever or worsening pain.  "

## 2024-07-31 NOTE — ED Triage Notes (Signed)
"  Patient arrives to ED reports right foot swelling states his toenail look like it is about to fall off + drainage.  Denies injury to foot or toes.   "

## 2024-07-31 NOTE — ED Notes (Signed)
 Pt left ED without discharge paper work. MD aware.

## 2024-07-31 NOTE — ED Provider Notes (Signed)
 "[facility] EMERGENCY DEPARTMENT  EMERGENCY DEPARTMENT ENCOUNTER       Pt Name: Kevin Davenport  MRN: [id number]  Birthdate Oct 20, 1955  Date of evaluation: 07/31/2024  Provider: Donnice JONELLE Lab, MD   PCP: Jackee Sharper, MD       CHIEF COMPLAINT       Chief Complaint   Patient presents with    Toe Pain        HISTORY OF PRESENT ILLNESS: 1 or more elements      History From: patient, History limited by: none     Kevin Davenport is a 68 y.o. male with a history of tobacco use presents to the emergency department with a chief complaint of toe pain.    Patient denies any trauma but reports noted pain and swelling of right great toe 3 days ago.  Patient states he only feels pain when he hits an object with it.  Denies any history of diabetes or peripheral artery disease.    Denies systemic symptoms including fevers or chills.  No other complaints.       Please See MDM for Additional Details of the HPI/PMH  Nursing Notes were all reviewed and agreed with or any disagreements were addressed in the HPI.     REVIEW OF SYSTEMS        Positives and Pertinent negatives as per HPI.    PAST HISTORY     Past Medical History:  Past Medical History:   Diagnosis Date    Arthritis     Other ill-defined conditions(799.89)     Pancreatitis    Pancreatitis        Past Surgical History:  Past Surgical History:   Procedure Laterality Date    COLONOSCOPY         Family History:  Family History   Problem Relation Age of Onset    Hypertension Father     Alzheimer's Disease Mother     Hypertension Mother     Diabetes Mother        Social History:  Social History     Tobacco Use    Smoking status: Every Day     Current packs/day: 0.25     Types: Cigarettes    Smokeless tobacco: Never    Tobacco comments:     Quit smoking: 1/2 ppd x 18 years   Substance Use Topics    Alcohol use: Not Currently     Alcohol/week: 10.0 standard drinks of alcohol     Types: 10 Standard drinks or equivalent per week    Drug use: No       Allergies:  No Known  Allergies    CURRENT MEDICATIONS      Discharge Medication List as of 07/31/2024  8:24 PM        CONTINUE these medications which have NOT CHANGED    Details   ibuprofen  (ADVIL ;MOTRIN ) 600 MG tablet Take 1 tablet by mouth every 6 hours as needed for Pain, Disp-20 tablet, R-1Normal      acetaminophen  (TYLENOL ) 325 MG tablet Take 2 tablets by mouth every 6 hours as neededHistorical Med      aspirin 81 MG EC tablet Take 1 tablet by mouth dailyHistorical Med      atorvastatin (LIPITOR) 20 MG tablet Take 1 tablet by mouth dailyHistorical Med      !! cloNIDine (CATAPRES) 0.1 MG tablet TAKE 2 TABLETS BY MOUTH TWICE DAILYHistorical Med      !! cloNIDine (CATAPRES) 0.3 MG tablet Take 1 tablet  by mouth 2 times dailyHistorical Med      diclofenac (VOLTAREN) 75 MG EC tablet Take 1 tablet by mouth 2 times dailyHistorical Med      sulfamethoxazole-trimethoprim (BACTRIM DS;SEPTRA DS) 800-160 MG per tablet Take 2 tablets by mouth 2 times dailyHistorical Med      terbinafine (LAMISIL) 250 MG tablet Take 1 tablet by mouth dailyHistorical Med       !! - Potential duplicate medications found. Please discuss with provider.          SCREENINGS               No data recorded            PHYSICAL EXAM      ED Triage Vitals [07/31/24 1719]   Encounter Vitals Group      BP (!) 194/84      Girls Systolic BP Percentile       Girls Diastolic BP Percentile       Boys Systolic BP Percentile       Boys Diastolic BP Percentile       Pulse 74      Respirations 18      Temp 97.5 F (36.4 C)      Temp Source Oral      SpO2 98 %      Weight - Scale 87.1 kg (192 lb)      Height 1.727 m (5' 8)      Head Circumference       Peak Flow       Pain Score       Pain Loc       Pain Education       Exclude from Growth Chart         Physical Exam  Vitals and nursing note reviewed.   Constitutional:       Comments: 68 year old male, resting in stretcher, no acute distress   HENT:      Head: Normocephalic and atraumatic.   Cardiovascular:      Rate and Rhythm:  Normal rate.      Pulses: Normal pulses.      Comments: 1+ PT and DP pulses right foot  Pulmonary:      Effort: Pulmonary effort is normal.   Musculoskeletal:         General: Tenderness present. No swelling.      Comments: There is thickening of the right great toenail.  There is some serous fluid and purulence under the cuticle.  Good distal capillary refill and sensation.   Neurological:      General: No focal deficit present.          DIAGNOSTIC RESULTS   LABS:     No results found for this or any previous visit (from the past 24 hours).    EKG: If performed, independent interpretation documented below in the MDM section  All EKGs independently interpreted by me.    RADIOLOGY:  Non-plain film images such as CT, Ultrasound and MRI are read by the radiologist. Plain radiographic images are visualized and preliminarily interpreted by the ED Provider with the findings documented in the MDM section.     Interpretation per the Radiologist below, if available at the time of this note:     XR TOE RIGHT (MIN 2 VIEWS)   Final Result   Corticated erosions. Consider gout as well as other indolent arthropathies         Electronically signed by Devere Cal  PROCEDURES   Unless otherwise noted below, none  Procedures    Procedure Note - Incision and Drainage:   7:15 PM EST  Performed by: Donnice JONELLE Lab, MD   Verbal Consent obtained  Skin prepped with Chlorprep.  Sterile field established.  Anesthesia achieved using a topical application of LET.   Paronychia to R great toe. Incision made with #11 blade. mild amount of serosanguinous fluid drainage was expressed.  Sterile dressing applied.    Estimated blood loss: minimal  Simple (Deroof OR Paronychia)  The procedure took 1-15 minutes, and pt tolerated well.      CRITICAL CARE TIME   0    EMERGENCY DEPARTMENT COURSE and DIFFERENTIAL DIAGNOSIS/MDM   Vitals:    Vitals:    07/31/24 1719   BP: (!) 194/84   Pulse: 74   Resp: 18   Temp: 97.5 F (36.4 C)   TempSrc: Oral    SpO2: 98%   Weight: 87.1 kg (192 lb)   Height: 1.727 m (5' 8)        Patient was given the following medications:  Medications   lidocaine -EPINEPHrine -tetracaine  (LET) topical gel 3 mL syringe (3 mLs Topical Given 07/31/24 1828)       Medical Decision Making  68 year old male presents with right great toe pain.  Vital signs are notable for elevated blood pressure.  Afebrile.  Right great toe appears most consistent with paronychia.  Will check plain film to evaluate for bony pathology or foreign object although my suspicion for osteomyelitis is low.  Will apply let and check plain film.  Does not appear consistent with podagra.  Similarly I do not think this is critical peripheral artery disease given good capillary refill.  Will observe in emergency department.    Amount and/or Complexity of Data Reviewed  Radiology: ordered.    Risk  Prescription drug management.          CLINICAL MANAGEMENT TOOLS:        ED Course as of 07/31/24 2243   Tue Jul 31, 2024   1858 Plain films interpreted by me shows no acute fracture, no dislocation [MB]   2015 Plain film shows evidence of prior gout, however I do not think this is the case of patient's current presentation.  Will discharge with antibiotics, podiatry follow-up.  Advised patient to return to ED in 2 days for wound recheck. [MB]      ED Course User Index  [MB] Lab Donnice JONELLE, MD     Upon reassessment of patient after radiology read of x-ray, patient had left the room.  I was unable to bandage his wound he did not receive Augmentin  ordered in the ED.  Patient's disposition changed to eloped and Augmentin  sent to pharmacy on file.  Patient did not receive paperwork.    FINAL IMPRESSION     1. Cellulitis of toe of right foot          DISPOSITION/PLAN     DISPOSITION Eloped - Left Before Treatment Complete 07/31/2024 10:42:51 PM   DISPOSITION CONDITION Stable     I am the Primary Clinician of Record.   Donnice JONELLE Lab, MD (electronically signed)    (Please note that  parts of this dictation were completed with voice recognition software. Quite often unanticipated grammatical, syntax, homophones, and other interpretive errors are inadvertently transcribed by the computer software. Please disregards these errors. Please excuse any errors that have escaped final proofreading.)         Lab Donnice  R, MD  07/31/24 2243    "

## 2024-08-01 MED ORDER — AMOXICILLIN-POT CLAVULANATE 875-125 MG PO TABS
875-125 | ORAL_TABLET | Freq: Two times a day (BID) | ORAL | 0 refills | 7.00000 days | Status: AC
Start: 2024-08-01 — End: 2024-08-07

## 2024-08-01 MED ORDER — AMOXICILLIN-POT CLAVULANATE 875-125 MG PO TABS
875-125 | ORAL | Status: DC
Start: 2024-08-01 — End: 2024-07-31
